# Patient Record
Sex: Male | Born: 1960 | Race: Black or African American | Hispanic: No | Marital: Married | Smoking: Former smoker
Health system: Southern US, Community
[De-identification: ages and names within clinical notes are randomized; demographics above are authoritative.]

## PROBLEM LIST (undated history)

## (undated) DIAGNOSIS — M1990 Unspecified osteoarthritis, unspecified site: Secondary | ICD-10-CM

## (undated) DIAGNOSIS — G4730 Sleep apnea, unspecified: Secondary | ICD-10-CM

## (undated) DIAGNOSIS — E785 Hyperlipidemia, unspecified: Secondary | ICD-10-CM

## (undated) DIAGNOSIS — T7840XA Allergy, unspecified, initial encounter: Secondary | ICD-10-CM

## (undated) DIAGNOSIS — I10 Essential (primary) hypertension: Secondary | ICD-10-CM

## (undated) HISTORY — DX: Allergy, unspecified, initial encounter: T78.40XA

## (undated) HISTORY — DX: Sleep apnea, unspecified: G47.30

## (undated) HISTORY — DX: Hyperlipidemia, unspecified: E78.5

## (undated) HISTORY — DX: Unspecified osteoarthritis, unspecified site: M19.90

---

## 1999-01-05 ENCOUNTER — Encounter: Payer: Self-pay | Admitting: Family Medicine

## 1999-01-05 ENCOUNTER — Ambulatory Visit (HOSPITAL_COMMUNITY): Admission: RE | Admit: 1999-01-05 | Discharge: 1999-01-05 | Payer: Self-pay | Admitting: Family Medicine

## 2000-08-15 ENCOUNTER — Emergency Department (HOSPITAL_COMMUNITY): Admission: EM | Admit: 2000-08-15 | Discharge: 2000-08-15 | Payer: Self-pay | Admitting: Emergency Medicine

## 2003-02-02 ENCOUNTER — Ambulatory Visit (HOSPITAL_COMMUNITY): Admission: RE | Admit: 2003-02-02 | Discharge: 2003-02-02 | Payer: Self-pay | Admitting: Family Medicine

## 2012-02-27 HISTORY — PX: OTHER SURGICAL HISTORY: SHX169

## 2012-06-09 ENCOUNTER — Encounter (HOSPITAL_COMMUNITY): Payer: Self-pay

## 2012-06-09 ENCOUNTER — Emergency Department (HOSPITAL_COMMUNITY)
Admission: EM | Admit: 2012-06-09 | Discharge: 2012-06-09 | Disposition: A | Discharge status: Home / Self Care | Payer: Non-veteran care | Attending: Emergency Medicine | Admitting: Emergency Medicine

## 2012-06-09 ENCOUNTER — Emergency Department (HOSPITAL_COMMUNITY): Payer: Non-veteran care

## 2012-06-09 DIAGNOSIS — Y9289 Other specified places as the place of occurrence of the external cause: Secondary | ICD-10-CM | POA: Insufficient documentation

## 2012-06-09 DIAGNOSIS — S79919A Unspecified injury of unspecified hip, initial encounter: Secondary | ICD-10-CM | POA: Insufficient documentation

## 2012-06-09 DIAGNOSIS — S99929A Unspecified injury of unspecified foot, initial encounter: Secondary | ICD-10-CM | POA: Insufficient documentation

## 2012-06-09 DIAGNOSIS — Y9301 Activity, walking, marching and hiking: Secondary | ICD-10-CM | POA: Insufficient documentation

## 2012-06-09 DIAGNOSIS — S8990XA Unspecified injury of unspecified lower leg, initial encounter: Secondary | ICD-10-CM | POA: Insufficient documentation

## 2012-06-09 DIAGNOSIS — X500XXA Overexertion from strenuous movement or load, initial encounter: Secondary | ICD-10-CM | POA: Insufficient documentation

## 2012-06-09 DIAGNOSIS — S8992XA Unspecified injury of left lower leg, initial encounter: Secondary | ICD-10-CM

## 2012-06-09 DIAGNOSIS — I10 Essential (primary) hypertension: Secondary | ICD-10-CM | POA: Insufficient documentation

## 2012-06-09 HISTORY — DX: Essential (primary) hypertension: I10

## 2012-06-09 MED ORDER — OXYCODONE-ACETAMINOPHEN 5-325 MG PO TABS
1.0000 | ORAL_TABLET | Freq: Four times a day (QID) | ORAL | Status: DC | PRN
Start: 2012-06-09 — End: 2013-09-18

## 2012-06-09 MED ORDER — HYDROMORPHONE HCL PF 1 MG/ML IJ SOLN
1.0000 mg | Freq: Once | INTRAMUSCULAR | Status: AC
  Administered 2012-06-09: 1 mg via INTRAVENOUS
  Filled 2012-06-09: qty 1

## 2012-06-09 MED ORDER — IBUPROFEN 400 MG PO TABS: 400.0000 mg | ORAL_TABLET | Freq: Four times a day (QID) | ORAL | Status: DC | PRN

## 2012-06-09 MED ORDER — ONDANSETRON HCL 4 MG/2ML IJ SOLN
4.0000 mg | Freq: Once | INTRAMUSCULAR | Status: AC
  Administered 2012-06-09: 4 mg via INTRAVENOUS
  Filled 2012-06-09: qty 2
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
## 2012-06-09 NOTE — ED Notes (Signed)
Given fentanyl in EMS
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
## 2012-06-12 NOTE — ED Provider Notes (Signed)
Medical screening examination/treatment/procedure(s) were performed by non-physician practitioner and as supervising physician I was immediately available for consultation/collaboration.   Laray Anger, DO 06/12/12 1525

## 2013-09-18 ENCOUNTER — Emergency Department (HOSPITAL_COMMUNITY)
Admission: EM | Admit: 2013-09-18 | Discharge: 2013-09-18 | Disposition: A | Discharge status: Home / Self Care | Payer: Non-veteran care | Attending: Emergency Medicine | Admitting: Emergency Medicine

## 2013-09-18 ENCOUNTER — Encounter (HOSPITAL_COMMUNITY): Payer: Self-pay | Admitting: Emergency Medicine

## 2013-09-18 ENCOUNTER — Emergency Department (HOSPITAL_COMMUNITY): Payer: Non-veteran care

## 2013-09-18 DIAGNOSIS — IMO0001 Reserved for inherently not codable concepts without codable children: Secondary | ICD-10-CM | POA: Insufficient documentation

## 2013-09-18 DIAGNOSIS — R079 Chest pain, unspecified: Secondary | ICD-10-CM | POA: Insufficient documentation

## 2013-09-18 DIAGNOSIS — R071 Chest pain on breathing: Secondary | ICD-10-CM | POA: Insufficient documentation

## 2013-09-18 DIAGNOSIS — R0789 Other chest pain: Secondary | ICD-10-CM

## 2013-09-18 DIAGNOSIS — Z79899 Other long term (current) drug therapy: Secondary | ICD-10-CM | POA: Insufficient documentation

## 2013-09-18 DIAGNOSIS — K5900 Constipation, unspecified: Secondary | ICD-10-CM | POA: Insufficient documentation

## 2013-09-18 DIAGNOSIS — F17200 Nicotine dependence, unspecified, uncomplicated: Secondary | ICD-10-CM | POA: Insufficient documentation

## 2013-09-18 DIAGNOSIS — I10 Essential (primary) hypertension: Secondary | ICD-10-CM | POA: Insufficient documentation

## 2013-09-18 LAB — COMPREHENSIVE METABOLIC PANEL
ALBUMIN: 4.5 g/dL (ref 3.5–5.2)
ALK PHOS: 76 U/L (ref 39–117)
ALT: 23 U/L (ref 0–53)
ANION GAP: 17 — AB (ref 5–15)
AST: 24 U/L (ref 0–37)
BILIRUBIN TOTAL: 0.2 mg/dL — AB (ref 0.3–1.2)
BUN: 16 mg/dL (ref 6–23)
CHLORIDE: 95 meq/L — AB (ref 96–112)
CO2: 23 meq/L (ref 19–32)
Calcium: 10.1 mg/dL (ref 8.4–10.5)
Creatinine, Ser: 1.2 mg/dL (ref 0.50–1.35)
GFR calc Af Amer: 79 mL/min — ABNORMAL LOW (ref >90)
GFR calc non Af Amer: 68 mL/min — ABNORMAL LOW (ref >90)
Glucose, Bld: 124 mg/dL — ABNORMAL HIGH (ref 70–99)
POTASSIUM: 3.9 meq/L (ref 3.7–5.3)
Sodium: 135 mEq/L — ABNORMAL LOW (ref 137–147)
Total Protein: 8.2 g/dL (ref 6.0–8.3)

## 2013-09-18 LAB — CBC WITH DIFFERENTIAL/PLATELET
BASOS PCT: 0 % (ref 0–1)
Basophils Absolute: 0 10*3/uL (ref 0.0–0.1)
Eosinophils Absolute: 0.1 10*3/uL (ref 0.0–0.7)
Eosinophils Relative: 1 % (ref 0–5)
HCT: 47.1 % (ref 39.0–52.0)
HEMOGLOBIN: 15.5 g/dL (ref 13.0–17.0)
LYMPHS ABS: 2.4 10*3/uL (ref 0.7–4.0)
LYMPHS PCT: 24 % (ref 12–46)
MCH: 30.2 pg (ref 26.0–34.0)
MCHC: 32.9 g/dL (ref 30.0–36.0)
MCV: 91.6 fL (ref 78.0–100.0)
MONOS PCT: 6 % (ref 3–12)
Monocytes Absolute: 0.6 10*3/uL (ref 0.1–1.0)
NEUTROS ABS: 6.9 10*3/uL (ref 1.7–7.7)
NEUTROS PCT: 69 % (ref 43–77)
Platelets: 234 10*3/uL (ref 150–400)
RBC: 5.14 MIL/uL (ref 4.22–5.81)
RDW: 13.3 % (ref 11.5–15.5)
WBC: 10 10*3/uL (ref 4.0–10.5)

## 2013-09-18 LAB — D-DIMER, QUANTITATIVE: D-Dimer, Quant: 0.51 ug/mL-FEU — ABNORMAL HIGH (ref 0.00–0.48)

## 2013-09-18 LAB — TROPONIN I: Troponin I: <0.3 ng/mL (ref <0.30)

## 2013-09-18 MED ORDER — METHOCARBAMOL 500 MG PO TABS
1000.0000 mg | ORAL_TABLET | Freq: Once | ORAL | Status: AC
  Administered 2013-09-18: 1000 mg via ORAL
  Filled 2013-09-18: qty 2

## 2013-09-18 MED ORDER — MORPHINE SULFATE 4 MG/ML IJ SOLN
4.0000 mg | Freq: Once | INTRAMUSCULAR | Status: AC
  Administered 2013-09-18: 4 mg via INTRAVENOUS
  Filled 2013-09-18: qty 1

## 2013-09-18 MED ORDER — IOHEXOL 350 MG/ML SOLN
100.0000 mL | Freq: Once | INTRAVENOUS | Status: AC | PRN
  Administered 2013-09-18: 100 mL via INTRAVENOUS

## 2013-09-18 MED ORDER — HYDROCODONE-ACETAMINOPHEN 5-325 MG PO TABS: 2.0000 | ORAL_TABLET | ORAL | Status: DC | PRN

## 2013-09-18 MED ORDER — OXYCODONE-ACETAMINOPHEN 5-325 MG PO TABS
1.0000 | ORAL_TABLET | Freq: Once | ORAL | Status: AC
  Administered 2013-09-18: 1 via ORAL
  Filled 2013-09-18: qty 1

## 2013-09-18 MED ORDER — ASPIRIN 81 MG PO CHEW
324.0000 mg | CHEWABLE_TABLET | Freq: Once | ORAL | Status: AC
  Administered 2013-09-18: 324 mg via ORAL
  Filled 2013-09-18: qty 4

## 2013-09-18 MED ORDER — POLYETHYLENE GLYCOL 3350 17 G PO PACK: 17.0000 g | PACK | Freq: Every day | ORAL | Status: DC

## 2013-09-18 MED ORDER — IBUPROFEN 600 MG PO TABS: 600.0000 mg | ORAL_TABLET | Freq: Four times a day (QID) | ORAL | Status: DC | PRN

## 2013-09-18 MED ORDER — LABETALOL HCL 5 MG/ML IV SOLN
10.0000 mg | Freq: Once | INTRAVENOUS | Status: AC
  Administered 2013-09-18: 10 mg via INTRAVENOUS
  Filled 2013-09-18: qty 4

## 2013-09-18 MED ORDER — METHOCARBAMOL 500 MG PO TABS: 500.0000 mg | ORAL_TABLET | Freq: Three times a day (TID) | ORAL | Status: DC | PRN

## 2013-09-18 NOTE — ED Notes (Signed)
Pt states he was sitting at home yesterday and began having left sided chest pain. Pt states this chest pain is unlike any other chest pain he has experienced. Rates 10/10 appears SOB. Pt is alert, oriented and ambulatory. Hx of hypertension.

## 2013-09-18 NOTE — ED Provider Notes (Signed)
CSN: [id number]     Arrival date & time 09/18/13  0057 History   First MD Initiated Contact with Patient 09/18/13 0101     Chief Complaint  Patient presents with  . Chest Pain     (Consider location/radiation/quality/duration/timing/severity/associated sxs/prior Treatment) HPI Patient presents with gradual onset left sided chest pain that started greater than 24 hours ago. The pain has been constant and persistent. The pain is worse with movement and palpation. He denies any known recent trauma. He's had no cough, fevers or chills. Patient has a history of hypertension for which she is followed at the [facility]. States he's not miss his medications recently. He has had no lower extremity swelling or pain. No extended travel or recent surgeries. Past Medical History  Diagnosis Date  . Hypertension    History reviewed. No pertinent past surgical history. History reviewed. No pertinent family history. History  Substance Use Topics  . Smoking status: Current Every Day Smoker    Types: Cigarettes  . Smokeless tobacco: Not on file  . Alcohol Use: Yes    Review of Systems  Constitutional: Negative for fever and chills.  Respiratory: Negative for cough, chest tightness and shortness of breath.   Cardiovascular: Positive for chest pain. Negative for palpitations and leg swelling.  Gastrointestinal: Positive for constipation. Negative for nausea, vomiting, abdominal pain, diarrhea and blood in stool.  Musculoskeletal: Positive for myalgias. Negative for back pain, neck pain and neck stiffness.  Skin: Negative for rash and wound.  Neurological: Negative for dizziness, weakness, light-headedness, numbness and headaches.  All other systems reviewed and are negative.     Allergies  Review of patient's allergies indicates no known allergies.  Home Medications   Prior to Admission medications   Medication Sig Start Date End Date Taking? Authorizing Provider  hydrochlorothiazide (HYDRODIURIL)  50 MG tablet Take 50 mg by mouth daily.   Yes Historical Provider, MD  lisinopril (PRINIVIL,ZESTRIL) 40 MG tablet Take 40 mg by mouth daily.   Yes Historical Provider, MD  HYDROcodone-acetaminophen (NORCO) 5-325 MG per tablet Take 2 tablets by mouth every 4 (four) hours as needed. 09/18/13   Loren Raceravid Jovi Zavadil, MD  ibuprofen (ADVIL,MOTRIN) 600 MG tablet Take 1 tablet (600 mg total) by mouth every 6 (six) hours as needed. 09/18/13   Loren Raceravid Dala Breault, MD  methocarbamol (ROBAXIN) 500 MG tablet Take 1 tablet (500 mg total) by mouth every 8 (eight) hours as needed for muscle spasms. 09/18/13   Loren Raceravid Rochelle Larue, MD  polyethylene glycol [facility](MIRALAX / Ethelene HalGLYCOLAX) packet Take 17 g by mouth daily. 09/18/13   Loren Raceravid Manas Hickling, MD   BP 160/108  Pulse 79  Temp(Src) 98.1 F (36.7 C) (Oral)  Resp 17  SpO2 98% Physical Exam  Nursing note and vitals reviewed. Constitutional: He is oriented to person, place, and time. He appears well-developed and well-nourished. No distress.  HENT:  Head: Normocephalic and atraumatic.  Mouth/Throat: Oropharynx is clear and moist.  Eyes: EOM are normal. Pupils are equal, round, and reactive to light.  Neck: Normal range of motion. Neck supple.  Cardiovascular: Normal rate and regular rhythm.   Pulmonary/Chest: Effort normal and breath sounds normal. No respiratory distress. He has no wheezes. He has no rales. He exhibits tenderness.  Patient's chest wall pain is completely reproduced with palpation of the left pectoralis and range of motion of the left shoulder. He has no crepitance or deformity.  Abdominal: Soft. Bowel sounds are normal. He exhibits distension (abdomen is firm and distended). He exhibits no mass. There  is no tenderness. There is no rebound and no guarding.  Musculoskeletal: Normal range of motion. He exhibits no edema and no tenderness.  No calf swelling or tenderness.  Neurological: He is alert and oriented to person, place, and time.  Patient is alert and oriented x3  with clear, goal oriented speech. Patient has 5/5 motor in all extremities. Sensation is intact to light touch.  Skin: Skin is warm and dry. No rash noted. No erythema.  Psychiatric: He has a normal mood and affect. His behavior is normal.    ED Course  Procedures (including critical care time) Labs Review Labs Reviewed  COMPREHENSIVE METABOLIC PANEL - Abnormal; Notable for the following:    Sodium 135 (*)    Chloride 95 (*)    Glucose, Bld 124 (*)    Total Bilirubin 0.2 (*)    GFR calc non Af Amer 68 (*)    GFR calc Af Amer 79 (*)    Anion gap 17 (*)    All other components within normal limits  D-DIMER, QUANTITATIVE - Abnormal; Notable for the following:    D-Dimer, Quant 0.51 (*)    All other components within normal limits  CBC WITH DIFFERENTIAL  TROPONIN I    Imaging Review Ct Angio Chest Pe W/cm &/or Wo Cm  09/18/2013   CLINICAL DATA:  Left chest pain and shortness breath for 2 days. Elevated D-dimer levels. Question pulmonary embolism.  EXAM: CT ANGIOGRAPHY CHEST WITH CONTRAST  TECHNIQUE: Multidetector CT imaging of the chest was performed using the standard protocol during bolus administration of intravenous contrast. Multiplanar CT image reconstructions and MIPs were obtained to evaluate the vascular anatomy.  CONTRAST:  OMNIPAQUE IOHEXOL 350 MG/ML SOLN  COMPARISON:  Acute abdominal series done 09/18/2013.  FINDINGS: Vascular: The pulmonary arteries are well opacified with contrast. There is no evidence of acute pulmonary embolism. There is no significant atherosclerosis.  Mediastinum: There are no enlarged mediastinal, hilar or axillary lymph nodes. The thyroid gland and trachea appear normal. There is a small hiatal hernia. The heart size is normal.  Lungs/Pleura: There is no pleural or pericardial effusion.There are streaky opacities in both lung bases which appear predominantly linear on the reformatted images. These are favored to reflect atelectasis or scarring. There  is no confluent airspace opacity or endobronchial lesion.  Upper abdomen: Unremarkable.  There is no adrenal mass.  Musculoskeletal/Chest wall: No chest wall lesion or acute osseous findings.  Review of the MIP images confirms the above findings.  IMPRESSION: 1. No evidence acute pulmonary embolism or other acute chest process. 2. Linear opacities at both lung bases most consistent with atelectasis or scarring. No confluent airspace opacity, pleural effusion or adenopathy.   Electronically Signed   By: Roxy Horseman M.D.   On: 09/18/2013 02:55   Dg Abd Acute W/chest  09/18/2013   CLINICAL DATA:  Worsening left-sided chest pain.  EXAM: ACUTE ABDOMEN SERIES (ABDOMEN 2 VIEW & CHEST 1 VIEW)  COMPARISON:  None.  FINDINGS: The lungs are well-aerated. Minimal bibasilar opacities likely reflect atelectasis. There is no evidence of pleural effusion or pneumothorax. The cardiomediastinal silhouette is within normal limits.  The visualized bowel gas pattern is unremarkable. Scattered stool and air are seen within the colon; there is no evidence of small bowel dilatation to suggest obstruction. No free intra-abdominal air is identified on the provided upright view.  No acute osseous abnormalities are seen; the sacroiliac joints are unremarkable in appearance.  IMPRESSION: 1. Unremarkable bowel gas pattern;  no free intra-abdominal air seen. Moderate amount of stool in the colon. 2. Minimal bibasilar airspace opacities likely reflect atelectasis. Lungs otherwise clear.   Electronically Signed   By: Roanna Raider M.D.   On: 09/18/2013 01:42     EKG Interpretation   Date/Time:  Friday September 18 2013 01:05:00 EDT Ventricular Rate:  105 PR Interval:  135 QRS Duration: 142 QT Interval:  356 QTC Calculation: 470 R Axis:   1 Text Interpretation:  Sinus tachycardia LAE, consider biatrial enlargement  Right bundle branch block Confirmed by Ivorie Uplinger  MD, Bilaal Leib ([id number]) on  09/18/2013 4:40:37 AM      MDM   Final  diagnoses:  Left-sided chest wall pain  Constipation, unspecified constipation type  Essential hypertension    D-dimer is slightly positive. CT angios chest without any acute findings. Patient's chest pain is completely reproducible with palpation and movement of the left shoulder consistent with muscular strain/chest wall pain. I have very low suspicion for coronary artery disease. Patient has no acute findings on his EKG. The pain has been constant for greater than 24 hours and the patient still has a normal troponin. He's been treated in the emergency department and his symptoms improved. His blood pressure in his also improved with medication. He's been advised of the need to followup with his primary Dr. regarding his elevated blood pressures. He's been given return precautions and is voiced understanding. Will also start the patient on MiraLax for his constipation. His abdomen is benign no moderate amount stool is seen on the x-ray. He admits to sparse bowel movements.    Loren Racer, MD 09/18/13 [phone]

## 2013-09-18 NOTE — Discharge Instructions (Signed)
Chest Wall Pain °Chest wall pain is pain in or around the bones and muscles of your chest. It may take up to 6 weeks to get better. It may take longer if you must stay physically active in your work and activities.  °CAUSES  °Chest wall pain may happen on its own. However, it may be caused by: °· A viral illness like the flu. °· Injury. °· Coughing. °· Exercise. °· Arthritis. °· Fibromyalgia. °· Shingles. °HOME CARE INSTRUCTIONS  °· Avoid overtiring physical activity. Try not to strain or perform activities that cause pain. This includes any activities using your chest or your abdominal and side muscles, especially if heavy weights are used. °· Put ice on the sore area. °¨ Put ice in a plastic bag. °¨ Place a towel between your skin and the bag. °¨ Leave the ice on for 15-20 minutes per hour while awake for the first 2 days. °· Only take over-the-counter or prescription medicines for pain, discomfort, or fever as directed by your caregiver. °SEEK IMMEDIATE MEDICAL CARE IF:  °· Your pain increases, or you are very uncomfortable. °· You have a fever. °· Your chest pain becomes worse. °· You have new, unexplained symptoms. °· You have nausea or vomiting. °· You feel sweaty or lightheaded. °· You have a cough with phlegm (sputum), or you cough up blood. °MAKE SURE YOU:  °· Understand these instructions. °· Will watch your condition. °· Will get help right away if you are not doing well or get worse. °Document Released: 02/12/2005 Document Revised: 05/07/2011 Document Reviewed: 10/09/2010 °Exit[employer]® Patient Information ©2015 [employer]. This information is not intended to replace advice given to you by your health care provider. Make sure you discuss any questions you have with your health care provider. ° °Hypertension °Hypertension, commonly called high blood pressure, is when the force of blood pumping through your arteries is too strong. Your arteries are the blood vessels that carry blood from your heart  throughout your body. A blood pressure reading consists of a higher number over a lower number, such as 110/72. The higher number (systolic) is the pressure inside your arteries when your heart pumps. The lower number (diastolic) is the pressure inside your arteries when your heart relaxes. Ideally you want your blood pressure below 120/80. °Hypertension forces your heart to work harder to pump blood. Your arteries may become narrow or stiff. Having hypertension puts you at risk for heart disease, stroke, and other problems.  °RISK FACTORS °Some risk factors for high blood pressure are controllable. Others are not.  °Risk factors you cannot control include:  °· Race. You may be at higher risk if you are African American. °· Age. Risk increases with age. °· Gender. Men are at higher risk than women before age 45 years. After age 65, women are at higher risk than men. °Risk factors you can control include: °· Not getting enough exercise or physical activity. °· Being overweight. °· Getting too much fat, sugar, calories, or salt in your diet. °· Drinking too much alcohol. °SIGNS AND SYMPTOMS °Hypertension does not usually cause signs or symptoms. Extremely high blood pressure (hypertensive crisis) may cause headache, anxiety, shortness of breath, and nosebleed. °DIAGNOSIS  °To check if you have hypertension, your health care provider will measure your blood pressure while you are seated, with your arm held at the level of your heart. It should be measured at least twice using the same arm. Certain conditions can cause a difference in blood pressure between   your right and left arms. A blood pressure reading that is higher than normal on one occasion does not mean that you need treatment. If one blood pressure reading is high, ask your health care provider about having it checked again. TREATMENT  Treating high blood pressure includes making lifestyle changes and possibly taking medicine. Living a healthy lifestyle can  help lower high blood pressure. You may need to change some of your habits. Lifestyle changes may include:  Following the DASH diet. This diet is high in fruits, vegetables, and whole grains. It is low in salt, red meat, and added sugars.  Getting at least 2 hours of brisk physical activity every week.  Losing weight if necessary.  Not smoking.  Limiting alcoholic beverages.  Learning ways to reduce stress. If lifestyle changes are not enough to get your blood pressure under control, your health care provider may prescribe medicine. You may need to take more than one. Work closely with your health care provider to understand the risks and benefits. HOME CARE INSTRUCTIONS  Have your blood pressure rechecked as directed by your health care provider.   Take medicines only as directed by your health care provider. Follow the directions carefully. Blood pressure medicines must be taken as prescribed. The medicine does not work as well when you skip doses. Skipping doses also puts you at risk for problems.   Do not smoke.   Monitor your blood pressure at home as directed by your health care provider. SEEK MEDICAL CARE IF:   You think you are having a reaction to medicines taken.  You have recurrent headaches or feel dizzy.  You have swelling in your ankles.  You have trouble with your vision. SEEK IMMEDIATE MEDICAL CARE IF:  You develop a severe headache or confusion.  You have unusual weakness, numbness, or feel faint.  You have severe chest or abdominal pain.  You vomit repeatedly.  You have trouble breathing. MAKE SURE YOU:   Understand these instructions.  Will watch your condition.  Will get help right away if you are not doing well or get worse. Document Released: 02/12/2005 Document Revised: 06/29/2013 Document Reviewed: 12/05/2012 [facility] Patient Information 2015 [place], [place]. This information is not intended to replace advice given to you by your  health care provider. Make sure you discuss any questions you have with your health care provider. Constipation Constipation is when a person has fewer than three bowel movements a week, has difficulty having a bowel movement, or has stools that are dry, hard, or larger than normal. As people grow older, constipation is more common. If you try to fix constipation with medicines that make you have a bowel movement (laxatives), the problem may get worse. Long-term laxative use may cause the muscles of the colon to become weak. A low-fiber diet, not taking in enough fluids, and taking certain medicines may make constipation worse.  CAUSES  Certain medicines, such as antidepressants, pain medicine, iron supplements, antacids, and water pills.  Certain diseases, such as diabetes, irritable bowel syndrome (IBS), thyroid disease, or depression.  Not drinking enough water.  Not eating enough fiber-rich foods.  Stress or travel.  Lack of physical activity or exercise.  Ignoring the urge to have a bowel movement.  Using laxatives too much.  SIGNS AND SYMPTOMS  Having fewer than three bowel movements a week.  Straining to have a bowel movement.  Having stools that are hard, dry, or larger than normal.  Feeling full or bloated.  Pain in the  lower abdomen.  Not feeling relief after having a bowel movement.  DIAGNOSIS  Your health care provider will take a medical history and perform a physical exam. Further testing may be done for severe constipation. Some tests may include: A barium enema X-ray to examine your rectum, colon, and, sometimes, your small intestine.  A sigmoidoscopy to examine your lower colon.  A colonoscopy to examine your entire colon. TREATMENT  Treatment will depend on the severity of your constipation and what is causing it. Some dietary treatments include drinking more fluids and eating more fiber-rich foods. Lifestyle treatments may include regular exercise. If these  diet and lifestyle recommendations do not help, your health care provider may recommend taking over-the-counter laxative medicines to help you have bowel movements. Prescription medicines may be prescribed if over-the-counter medicines do not work.  HOME CARE INSTRUCTIONS  Eat foods that have a lot of fiber, such as fruits, vegetables, whole grains, and beans. Limit foods high in fat and processed sugars, such as french fries, hamburgers, cookies, candies, and soda.  A fiber supplement may be added to your diet if you cannot get enough fiber from foods.  Drink enough fluids to keep your urine clear or pale yellow.  Exercise regularly or as directed by your health care provider.  Go to the restroom when you have the urge to go. Do not hold it.  Only take over-the-counter or prescription medicines as directed by your health care provider. Do not take other medicines for constipation without talking to your health care provider first.  SEEK IMMEDIATE MEDICAL CARE IF:  You have bright red blood in your stool.  Your constipation lasts for more than 4 days or gets worse.  You have abdominal or rectal pain.  You have thin, pencil-like stools.  You have unexplained weight loss. MAKE SURE YOU:  Understand these instructions. Will watch your condition. Will get help right away if you are not doing well or get worse. Document Released: 11/11/2003 Document Revised: 02/17/2013 Document Reviewed: 11/24/2012 [facility] Patient Information 2015 [place], [place]. This information is not intended to replace advice given to you by your health care provider. Make sure you discuss any questions you have with your health care provider.

## 2017-02-13 ENCOUNTER — Encounter: Payer: Self-pay | Admitting: Gastroenterology

## 2017-03-27 ENCOUNTER — Ambulatory Visit (AMBULATORY_SURGERY_CENTER): Payer: Self-pay | Admitting: *Deleted

## 2017-03-27 ENCOUNTER — Other Ambulatory Visit: Payer: Self-pay

## 2017-03-27 VITALS — Ht 67.5 in | Wt 204.4 lb

## 2017-03-27 DIAGNOSIS — Z1211 Encounter for screening for malignant neoplasm of colon: Secondary | ICD-10-CM

## 2017-03-27 MED ORDER — PEG-KCL-NACL-NASULF-NA ASC-C 100 G PO SOLR: 1.0000 | Freq: Once | ORAL | 0 refills | Status: AC

## 2017-03-27 NOTE — Progress Notes (Signed)
No egg or soy allergy known to patient  No issues with past sedation with any surgeries  or procedures, no intubation problems  No diet pills per patient No home 02 use per patient  No blood thinners per patient  Pt denies issues with constipation  No A fib or A flutter  EMMI video sent to pt's e mail  

## 2017-04-08 ENCOUNTER — Encounter: Payer: Self-pay | Admitting: Gastroenterology

## 2017-04-08 ENCOUNTER — Ambulatory Visit (AMBULATORY_SURGERY_CENTER): Payer: Non-veteran care | Admitting: Gastroenterology

## 2017-04-08 VITALS — BP 149/106 | HR 64 | Temp 98.4°F | Resp 10 | Ht 67.0 in | Wt 204.0 lb

## 2017-04-08 DIAGNOSIS — Z1211 Encounter for screening for malignant neoplasm of colon: Secondary | ICD-10-CM

## 2017-04-08 DIAGNOSIS — Z1212 Encounter for screening for malignant neoplasm of rectum: Secondary | ICD-10-CM

## 2017-04-08 MED ORDER — SODIUM CHLORIDE 0.9 % IV SOLN
500.0000 mL | Freq: Once | INTRAVENOUS | Status: DC
Start: 2017-04-08 — End: 2022-05-11

## 2017-04-08 NOTE — Progress Notes (Signed)
Pt's states no medical or surgical changes since previsit or office visit. 

## 2017-04-08 NOTE — Op Note (Signed)
[facility] Patient Name: Ricky MediateRobert Peck Procedure Date: 04/08/2017 9:39 AM MRN: [id number] Endoscopist: Sherilyn CooterHenry L. Myrtie Neitheranis , MD Age: 57 Referring MD:  Date of Birth: 06/17/1960 Gender: Male Account #: [id number] Procedure:                Colonoscopy Indications:              Screening for colorectal malignant neoplasm, This                            is the patient's first colonoscopy Medicines:                Monitored Anesthesia Care Procedure:                Pre-Anesthesia Assessment:                           - Prior to the procedure, a History and Physical                            was performed, and patient medications and                            allergies were reviewed. The patient's tolerance of                            previous anesthesia was also reviewed. The risks                            and benefits of the procedure and the sedation                            options and risks were discussed with the patient.                            All questions were answered, and informed consent                            was obtained. Prior Anticoagulants: The patient has                            taken no previous anticoagulant or antiplatelet                            agents. ASA Grade Assessment: II - A patient with                            mild systemic disease. After reviewing the risks                            and benefits, the patient was deemed in                            satisfactory condition to undergo the procedure.  After obtaining informed consent, the colonoscope                            was passed under direct vision. Throughout the                            procedure, the patient's blood pressure, pulse, and                            oxygen saturations were monitored continuously. The                            Colonoscope was introduced through the anus and                            advanced to the the cecum,  identified by                            appendiceal orifice and ileocecal valve. The                            colonoscopy was performed without difficulty. The                            patient tolerated the procedure well. The quality                            of the bowel preparation was good after lavage. The                            ileocecal valve, appendiceal orifice, and rectum                            were photographed. The bowel preparation used was                            SUPREP. Scope In: 9:53:07 AM Scope Out: 10:04:29 AM Scope Withdrawal Time: 0 hours 7 minutes 53 seconds  Total Procedure Duration: 0 hours 11 minutes 22 seconds  Findings:                 The perianal and digital rectal examinations were                            normal.                           The entire examined colon appeared normal on direct                            and retroflexion views. Complications:            No immediate complications. Estimated Blood Loss:     Estimated blood loss: none. Impression:               - The entire examined colon is normal on  direct and                            retroflexion views.                           - No specimens collected. Recommendation:           - Patient has a contact number available for                            emergencies. The signs and symptoms of potential                            delayed complications were discussed with the                            patient. Return to normal activities tomorrow.                            Written discharge instructions were provided to the                            patient.                           - Resume previous diet.                           - Continue present medications.                           - Repeat colonoscopy in 10 years for screening                            purposes. Ricky Peck L. Myrtie Neither, MD 04/08/2017 10:11:31 AM This report has been signed electronically.

## 2017-04-08 NOTE — Patient Instructions (Signed)
YOU HAD AN ENDOSCOPIC PROCEDURE TODAY AT THE [facility]:   Refer to the procedure report that was given to you for any specific questions about what was found during the examination.  If the procedure report does not answer your questions, please call your gastroenterologist to clarify.  If you requested that your care partner not be given the details of your procedure findings, then the procedure report has been included in a sealed envelope for you to review at your convenience later.  YOU SHOULD EXPECT: Some feelings of bloating in the abdomen. Passage of more gas than usual.  Walking can help get rid of the air that was put into your GI tract during the procedure and reduce the bloating. If you had a lower endoscopy (such as a colonoscopy or flexible sigmoidoscopy) you may notice spotting of blood in your stool or on the toilet paper. If you underwent a bowel prep for your procedure, you may not have a normal bowel movement for a few days.  Please Note:  You might notice some irritation and congestion in your nose or some drainage.  This is from the oxygen used during your procedure.  There is no need for concern and it should clear up in a day or so.  SYMPTOMS TO REPORT IMMEDIATELY:   Following lower endoscopy (colonoscopy or flexible sigmoidoscopy):  Excessive amounts of blood in the stool  Significant tenderness or worsening of abdominal pains  Swelling of the abdomen that is new, acute  Fever of 100F or higher  For urgent or emergent issues, a gastroenterologist can be reached at any hour by calling [phone].   DIET:  We do recommend a small meal at first, but then you may proceed to your regular diet.  Drink plenty of fluids but you should avoid alcoholic beverages for 24 hours.  MEDICATIONS:  Continue present medications.  ACTIVITY:  You should plan to take it easy for the rest of today and you should NOT DRIVE or use heavy machinery until tomorrow (because of the  sedation medicines used during the test).    FOLLOW UP: Our staff will call the number listed on your records the next business day following your procedure to check on you and address any questions or concerns that you may have regarding the information given to you following your procedure. If we do not reach you, we will leave a message.  However, if you are feeling well and you are not experiencing any problems, there is no need to return our call.  We will assume that you have returned to your regular daily activities without incident.  If any biopsies were taken you will be contacted by phone or by letter within the next 1-3 weeks.  Please call us at [phone] if you have not heard about the biopsies in 3 weeks.   Thank you for allowing us to provide for your healthcare needs today.   SIGNATURES/CONFIDENTIALITY: You and/or your care partner have signed paperwork which will be entered into your electronic medical record.  These signatures attest to the fact that that the information above on your After Visit Summary has been reviewed and is understood.  Full responsibility of the confidentiality of this discharge information lies with you and/or your care-partner. 

## 2017-04-08 NOTE — Progress Notes (Signed)
To PACU, VSS. Report to [email or web address] 

## 2017-04-09 ENCOUNTER — Telehealth: Payer: Self-pay

## 2017-04-09 NOTE — Telephone Encounter (Signed)
  Follow up Call-  Call back number 04/08/2017  Post procedure Call Back phone  # [phone]  Permission to leave phone message No  Some recent data might be hidden     Patient questions:  Do you have a fever, pain , or abdominal swelling? No. Pain Score  0 *  Have you tolerated food without any problems? Yes.    Have you been able to return to your normal activities? Yes.    Do you have any questions about your discharge instructions: Diet   No. Medications  No. Follow up visit  No.  Do you have questions or concerns about your Care? No.  Actions: * If pain score is 4 or above: No action needed, pain <4.  No problems noted per pt. maw

## 2017-06-26 ENCOUNTER — Telehealth: Payer: Self-pay | Admitting: Radiation Oncology

## 2017-06-26 NOTE — Telephone Encounter (Signed)
Opened in error

## 2019-12-09 ENCOUNTER — Emergency Department (HOSPITAL_COMMUNITY): Payer: No Typology Code available for payment source

## 2019-12-09 ENCOUNTER — Emergency Department (HOSPITAL_COMMUNITY)
Admission: EM | Admit: 2019-12-09 | Discharge: 2019-12-09 | Disposition: A | Discharge status: Home / Self Care | Payer: No Typology Code available for payment source | Attending: Emergency Medicine | Admitting: Emergency Medicine

## 2019-12-09 ENCOUNTER — Encounter (HOSPITAL_COMMUNITY): Payer: Self-pay | Admitting: Emergency Medicine

## 2019-12-09 DIAGNOSIS — R519 Headache, unspecified: Secondary | ICD-10-CM | POA: Diagnosis present

## 2019-12-09 DIAGNOSIS — M546 Pain in thoracic spine: Secondary | ICD-10-CM | POA: Insufficient documentation

## 2019-12-09 DIAGNOSIS — I10 Essential (primary) hypertension: Secondary | ICD-10-CM | POA: Insufficient documentation

## 2019-12-09 DIAGNOSIS — Z87891 Personal history of nicotine dependence: Secondary | ICD-10-CM | POA: Insufficient documentation

## 2019-12-09 DIAGNOSIS — M542 Cervicalgia: Secondary | ICD-10-CM | POA: Insufficient documentation

## 2019-12-09 DIAGNOSIS — Z79899 Other long term (current) drug therapy: Secondary | ICD-10-CM | POA: Insufficient documentation

## 2019-12-09 DIAGNOSIS — Y92410 Unspecified street and highway as the place of occurrence of the external cause: Secondary | ICD-10-CM | POA: Insufficient documentation

## 2019-12-09 MED ORDER — ACETAMINOPHEN 325 MG PO TABS
650.0000 mg | ORAL_TABLET | Freq: Once | ORAL | Status: AC
  Administered 2019-12-09: 650 mg via ORAL
  Filled 2019-12-09: qty 2
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
## 2019-12-09 NOTE — Discharge Instructions (Addendum)
Your blood pressure is noted to be elevated in the ED today. Be sure to take your blood pressure medication as soon as you get home.

## 2019-12-09 NOTE — ED Notes (Signed)
Patient transported to x-ray. ?
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
## 2019-12-09 NOTE — ED Notes (Signed)
Patient transported to CT 

## 2021-08-12 ENCOUNTER — Ambulatory Visit
Admission: EM | Admit: 2021-08-12 | Discharge: 2021-08-12 | Disposition: A | Discharge status: Home / Self Care | Payer: No Typology Code available for payment source | Attending: Family Medicine | Admitting: Family Medicine

## 2021-08-12 DIAGNOSIS — N3090 Cystitis, unspecified without hematuria: Secondary | ICD-10-CM | POA: Diagnosis not present

## 2021-08-12 DIAGNOSIS — Z20828 Contact with and (suspected) exposure to other viral communicable diseases: Secondary | ICD-10-CM | POA: Diagnosis not present

## 2021-08-12 LAB — POCT URINALYSIS DIP (MANUAL ENTRY)
Glucose, UA: NEGATIVE mg/dL
Nitrite, UA: POSITIVE — AB
Protein Ur, POC: >=300 mg/dL — AB
Spec Grav, UA: 1.025 (ref 1.010–1.025)
Urobilinogen, UA: 1 E.U./dL
pH, UA: 5.5 (ref 5.0–8.0)

## 2021-08-12 MED ORDER — SULFAMETHOXAZOLE-TRIMETHOPRIM 800-160 MG PO TABS: 1.0000 | ORAL_TABLET | Freq: Two times a day (BID) | ORAL | 0 refills | Status: AC

## 2021-08-12 NOTE — ED Triage Notes (Signed)
Pt states he thinks he has a UTI that started a few days ago  Pt states he is having urination problems  Pt states he had some body aches and cold chills last night

## 2021-08-14 LAB — URINE CULTURE: Culture: >=100000 — AB

## 2021-08-14 NOTE — ED Provider Notes (Addendum)
[facility]    ASSESSMENT & PLAN:  1. Cystitis   2. Exposure to the flu    No signs/symptoms of pyelonephritis. Begin: Meds ordered this encounter  Medications   sulfamethoxazole-trimethoprim (BACTRIM DS) 800-160 MG tablet    Sig: Take 1 tablet by mouth 2 (two) times daily for 7 days.    Dispense:  14 tablet    Refill:  0   Tolerating PO intake without emesis. Urine culture sent.   Follow-up Information     [facility] EMERGENCY DEPARTMENT.   Specialty: Emergency Medicine Why: If worsening or failing to improve as anticipated. Contact information: [address] [phone] mc [place] [address] [phone]               Labs Reviewed  URINE CULTURE - Abnormal; Notable for the following components:      Result Value   Culture >=100,000 COLONIES/mL ESCHERICHIA COLI (*)    Organism ID, Bacteria ESCHERICHIA COLI (*)    All other components within normal limits  POCT URINALYSIS DIP (MANUAL ENTRY) - Abnormal; Notable for the following components:   Color, UA red (*)    Bilirubin, UA small (*)    Ketones, POC UA trace (5) (*)    Blood, UA large (*)    Protein Ur, POC >=300 (*)    Nitrite, UA Positive (*)    Leukocytes, UA Large (3+) (*)    All other components within normal limits  COVID-19, FLU A+B NAA   E.Coli is sensitive to Bactrim.  Outlined signs and symptoms indicating need for more acute intervention. Patient verbalized understanding. After Visit Summary given.  SUBJECTIVE:  [facility] is a 61 y.o. male who complains of urinary frequency, urgency and mild dysuria for the past 1-2 days. Without associated flank pain, fever, chills, penile discharge or bleeding. Body aches last evening. Gross hematuria: not present. No specific aggravating or alleviating factors reported. No LE edema. Normal PO intake without n/v/d. Without specific abdominal pain. Ambulatory without difficulty.  OBJECTIVE:  Vitals:   08/12/21 1147   BP: 111/75  Pulse: 87  Resp: 20  Temp: 99 F (37.2 C)  TempSrc: Oral  SpO2: 95%   General appearance: alert; no distress HENT: oropharynx: moist Lungs: unlabored respirations Abdomen: soft Back: no CVA tenderness Extremities: no edema; symmetrical with no gross deformities Skin: warm and dry Neurologic: normal gait Psychological: alert and cooperative; normal mood and affect  Labs Reviewed  URINE CULTURE - Abnormal; Notable for the following components:      Result Value   Culture >=100,000 COLONIES/mL ESCHERICHIA COLI (*)    Organism ID, Bacteria ESCHERICHIA COLI (*)    All other components within normal limits  POCT URINALYSIS DIP (MANUAL ENTRY) - Abnormal; Notable for the following components:   Color, UA red (*)    Bilirubin, UA small (*)    Ketones, POC UA trace (5) (*)    Blood, UA large (*)    Protein Ur, POC >=300 (*)    Nitrite, UA Positive (*)    Leukocytes, UA Large (3+) (*)    All other components within normal limits  COVID-19, FLU A+B NAA    No Known Allergies  Past Medical History:  Diagnosis Date   Allergy    Arthritis    Hyperlipidemia    Hypertension    Sleep apnea    cpap   Social History   Socioeconomic History   Marital status: Married    Spouse name: Not on  file   Number of children: Not on file   Years of education: Not on file   Highest education level: Not on file  Occupational History   Not on file  Tobacco Use   Smoking status: Former    Types: Cigarettes    Quit date: 02/25/2017    Years since quitting: 4.4   Smokeless tobacco: Never  Vaping Use   Vaping Use: Never used  Substance and Sexual Activity   Alcohol use: Yes    Comment: occasionally   Drug use: No   Sexual activity: Yes    Birth control/protection: None  Other Topics Concern   Not on file  Social History Narrative   Not on file   Social Determinants of Health   Financial Resource Strain: Not on file  Food Insecurity: Not on file  Transportation  Needs: Not on file  Physical Activity: Not on file  Stress: Not on file  Social Connections: Not on file  Intimate Partner Violence: Not on file   Family History  Problem Relation Age of Onset   Colon cancer Neg Hx    Colon polyps Neg Hx    Esophageal cancer Neg Hx    Rectal cancer Neg Hx    Stomach cancer Neg Hx         Mardella Layman, MD 08/14/21 1123    Mardella Layman, MD 08/14/21 1124

## 2021-08-15 LAB — COVID-19, FLU A+B NAA
Influenza A, NAA: NOT DETECTED
Influenza B, NAA: NOT DETECTED
SARS-CoV-2, NAA: NOT DETECTED

## 2021-10-19 ENCOUNTER — Emergency Department (HOSPITAL_COMMUNITY): Payer: No Typology Code available for payment source

## 2021-10-19 ENCOUNTER — Emergency Department (HOSPITAL_COMMUNITY)
Admission: EM | Admit: 2021-10-19 | Discharge: 2021-10-20 | Disposition: A | Discharge status: Home / Self Care | Payer: No Typology Code available for payment source | Attending: Emergency Medicine | Admitting: Emergency Medicine

## 2021-10-19 ENCOUNTER — Other Ambulatory Visit: Payer: Self-pay

## 2021-10-19 ENCOUNTER — Encounter (HOSPITAL_COMMUNITY): Payer: Self-pay | Admitting: Emergency Medicine

## 2021-10-19 DIAGNOSIS — I10 Essential (primary) hypertension: Secondary | ICD-10-CM | POA: Insufficient documentation

## 2021-10-19 DIAGNOSIS — Z7901 Long term (current) use of anticoagulants: Secondary | ICD-10-CM | POA: Diagnosis not present

## 2021-10-19 DIAGNOSIS — Z79899 Other long term (current) drug therapy: Secondary | ICD-10-CM | POA: Diagnosis not present

## 2021-10-19 DIAGNOSIS — M25562 Pain in left knee: Secondary | ICD-10-CM

## 2021-10-19 DIAGNOSIS — R2242 Localized swelling, mass and lump, left lower limb: Secondary | ICD-10-CM | POA: Diagnosis not present

## 2021-10-19 LAB — CBC WITH DIFFERENTIAL/PLATELET
Abs Immature Granulocytes: 0.05 10*3/uL (ref 0.00–0.07)
Basophils Absolute: 0 10*3/uL (ref 0.0–0.1)
Basophils Relative: 0 %
Eosinophils Absolute: 0.1 10*3/uL (ref 0.0–0.5)
Eosinophils Relative: 1 %
HCT: 46.1 % (ref 39.0–52.0)
Hemoglobin: 14.5 g/dL (ref 13.0–17.0)
Immature Granulocytes: 1 %
Lymphocytes Relative: 20 %
Lymphs Abs: 2.1 10*3/uL (ref 0.7–4.0)
MCH: 29.2 pg (ref 26.0–34.0)
MCHC: 31.5 g/dL (ref 30.0–36.0)
MCV: 92.8 fL (ref 80.0–100.0)
Monocytes Absolute: 0.8 10*3/uL (ref 0.1–1.0)
Monocytes Relative: 8 %
Neutro Abs: 7.2 10*3/uL (ref 1.7–7.7)
Neutrophils Relative %: 70 %
Platelets: 176 10*3/uL (ref 150–400)
RBC: 4.97 MIL/uL (ref 4.22–5.81)
RDW: 14.3 % (ref 11.5–15.5)
WBC: 10.1 10*3/uL (ref 4.0–10.5)
nRBC: 0 % (ref 0.0–0.2)

## 2021-10-19 LAB — SYNOVIAL CELL COUNT + DIFF, W/ CRYSTALS
Crystals, Fluid: NONE SEEN
Eosinophils-Synovial: 1 % (ref 0–1)
Lymphocytes-Synovial Fld: 42 % — ABNORMAL HIGH (ref 0–20)
Monocyte-Macrophage-Synovial Fluid: 5 % — ABNORMAL LOW (ref 50–90)
Neutrophil, Synovial: 39 % — ABNORMAL HIGH (ref 0–25)
Other Cells-SYN: 13
WBC, Synovial: 39 /mm3 (ref 0–200)

## 2021-10-19 LAB — BASIC METABOLIC PANEL
Anion gap: 6 (ref 5–15)
BUN: 12 mg/dL (ref 6–20)
CO2: 25 mmol/L (ref 22–32)
Calcium: 9.4 mg/dL (ref 8.9–10.3)
Chloride: 103 mmol/L (ref 98–111)
Creatinine, Ser: 1.21 mg/dL (ref 0.61–1.24)
GFR, Estimated: >60 mL/min (ref >60)
Glucose, Bld: 103 mg/dL — ABNORMAL HIGH (ref 70–99)
Potassium: 4.2 mmol/L (ref 3.5–5.1)
Sodium: 134 mmol/L — ABNORMAL LOW (ref 135–145)

## 2021-10-19 LAB — C-REACTIVE PROTEIN: CRP: 10.6 mg/dL — ABNORMAL HIGH (ref <1.0)

## 2021-10-19 LAB — SEDIMENTATION RATE: Sed Rate: 15 mm/hr (ref 0–16)

## 2021-10-19 LAB — LACTIC ACID, PLASMA: Lactic Acid, Venous: 0.8 mmol/L (ref 0.5–1.9)

## 2021-10-19 MED ORDER — LIDOCAINE-EPINEPHRINE (PF) 2 %-1:200000 IJ SOLN
20.0000 mL | Freq: Once | INTRAMUSCULAR | Status: AC
  Administered 2021-10-19: 20 mL
  Filled 2021-10-19: qty 20

## 2021-10-19 MED ORDER — SULFAMETHOXAZOLE-TRIMETHOPRIM 800-160 MG PO TABS: 2.0000 | ORAL_TABLET | Freq: Two times a day (BID) | ORAL | 0 refills | Status: AC

## 2021-10-19 MED ORDER — OXYCODONE-ACETAMINOPHEN 5-325 MG PO TABS
1.0000 | ORAL_TABLET | ORAL | Status: DC | PRN
  Administered 2021-10-19: 1 via ORAL
  Filled 2021-10-19: qty 1

## 2021-10-19 MED ORDER — SULFAMETHOXAZOLE-TRIMETHOPRIM 800-160 MG PO TABS
2.0000 | ORAL_TABLET | Freq: Once | ORAL | Status: AC
  Administered 2021-10-19: 2 via ORAL
  Filled 2021-10-19: qty 2

## 2021-10-19 NOTE — ED Provider Notes (Signed)
[facility] EMERGENCY DEPARTMENT Provider Note   CSN: [id number] Arrival date & time: 10/19/21  1429     History  Chief Complaint  Patient presents with   Knee Pain    Ricky [facility] is a 61 y.o. male w/ HTN, history DVT on Eliquis, chronic low back pain, HLD, insomnia, ED, history of left quadriceps tendon rupture, OSA, tobacco abuse, who presents with left knee pain.  Patient reports approximately 1 week of worsening left knee pain and swelling with redness and warmth.  Patient has a no history of similar but states that when he was in high school he had multiple effusions of the knee that required periodic drainage of fluid.  He was seen at the [facility] at his PCP today and was sent here for evaluation of the septic joint.  Denies any fevers or chills.  Endorses pain with passive range of motion and is able to ambulate but states it is painful.  Takes Eliquis twice daily. Patient has had quadriceps tendon repair of the left knee but has no hardware in that knee. Had a normal white blood cell count at the [place] today and was hemodynamically stable. 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  Patient has been compliant with his Eliquis."  X-ray demonstrated "marked soft tissue swelling anteriorly and inferior to the patella which may represent bursitis or cellulitis no acute bony abnormality."   Knee Pain      Home Medications Prior to Admission medications   Medication Sig Start Date End Date Taking? Authorizing Provider  sulfamethoxazole-trimethoprim (BACTRIM DS) 800-160 MG tablet Take 2 tablets by mouth 2 (two) times daily for 7 days. 10/19/21 10/26/21 Yes Loetta Rough, MD  amLODipine (NORVASC) 10 MG tablet Take by mouth.    [provider]  atorvastatin (LIPITOR) 40 MG tablet Take by mouth. 03/06/17   [provider]   diphenhydrAMINE (BENADRYL) 25 MG tablet Take 25 mg by mouth daily.    [provider]  hydrochlorothiazide (HYDRODIURIL) 50 MG tablet Take 50 mg by mouth daily.    [provider]  hydroquinone 4 % cream Apply topically.    [provider]  losartan (COZAAR) 25 MG tablet Take by mouth. 03/06/17   [provider]      Allergies    Patient has no known allergies.    Review of Systems   Review of Systems Review of systems negative for fever.  A 10 point review of systems was performed and is negative unless otherwise reported in HPI.  Physical Exam Updated Vital Signs BP (!) 159/104   Pulse 63   Temp 98.5 F (36.9 C) (Oral)   Resp 18   SpO2 98%  Physical Exam General: Normal appearing male, sitting up in bed.  HEENT: Sclera anicteric, MMM, trachea midline, neck supple Cardiology: RRR, no murmurs/rubs/gallops. BL radial and DP pulses equal bilaterally.  Resp: Normal respiratory rate and effort. CTAB, no wheezes, rhonchi, crackles.  Abd: Soft, non-tender, non-distended. No rebound tenderness or guarding.  GU: Deferred. MSK: Joint effusion of L knee. Superficial area of redness, warmth, and swelling over anterior inferior aspect of joint. No warmth/erythema over superior anterior aspect of joint. TTP diffusely throughout joint especially inferior margin. No peripheral edema or signs of trauma. Longitudinal scar anterior L knee. Pain with passive and active ROM.  Skin: warm, dry. No rashes or lesions. Neuro:  A&Ox4, CNs II-XII grossly intact. MAEs. Sensation grossly intact.  Psych: Normal mood and affect.   ED Results / Procedures / Treatments   Labs (all labs ordered are listed, but only abnormal results are displayed) Labs Reviewed  BASIC METABOLIC PANEL - Abnormal; Notable for the following components:      Result Value   Sodium 134 (*)    Glucose, Bld 103 (*)    All other components within normal limits  C-REACTIVE PROTEIN - Abnormal; Notable  for the following components:   CRP 10.6 (*)    All other components within normal limits  SYNOVIAL CELL COUNT + DIFF, W/ CRYSTALS - Abnormal; Notable for the following components:   Color, Synovial PINK (*)    Appearance-Synovial CLOUDY (*)    Neutrophil, Synovial 39 (*)    Lymphocytes-Synovial Fld 42 (*)    Monocyte-Macrophage-Synovial Fluid 5 (*)    All other components within normal limits  BODY FLUID CULTURE W GRAM STAIN  CBC WITH DIFFERENTIAL/PLATELET  LACTIC ACID, PLASMA  SEDIMENTATION RATE    EKG None  Radiology DG Knee Complete 4 Views Left  Result Date: 10/19/2021 CLINICAL DATA:  Pain and swelling. The patient reports prior knee surgery in 2014. EXAM: LEFT KNEE - COMPLETE 4+ VIEW COMPARISON:  None Available. FINDINGS: No evidence of fracture, dislocation, or joint effusion. Degenerative changes are noted at the insertion of the patellar tendon and quadriceps tendon. There is soft tissue swelling anterior to the knee and patella as well as superior to the patella. IMPRESSION: Soft tissue swelling around the knee. No acute fracture or joint effusion. Electronically Signed   By: Romona Curls M.D.   On: 10/19/2021 16:37    Procedures .Joint Aspiration/Arthrocentesis  Date/Time: 10/19/2021 7:10 PM  Performed by: Loetta Rough, MD Authorized by: Loetta Rough, MD   Consent:    Consent obtained:  Written   Consent given by:  Patient   Risks, benefits, and alternatives were discussed: yes     Risks discussed:  Bleeding, infection, pain, poor cosmetic result, nerve damage and incomplete drainage   Alternatives discussed:  No treatment Universal protocol:    Procedure explained and questions answered to patient or proxy's satisfaction: yes     Relevant documents present and verified: yes     Test results available: yes     Imaging studies available: yes     Required blood products, implants, devices, and special equipment available: yes     Site/side marked: yes      Immediately prior to procedure, a time out was called: yes     Patient identity confirmed:  Verbally with patient Location:    Location:  Knee   Knee:  L knee Anesthesia:    Anesthesia method:  Local infiltration   Local anesthetic:  Lidocaine 2% WITH epi Procedure details:    Ultrasound guidance: no     Approach: superior lateral approach.   Aspirate amount:  4 cc   Aspirate characteristics:  Blood-tinged and yellow   Steroid injected: no     Specimen collected: yes   Post-procedure details:    Dressing:  Adhesive bandage   Procedure completion:  Tolerated well, no immediate complications     Medications Ordered in ED Medications  oxyCODONE-acetaminophen (PERCOCET/ROXICET) 5-325 MG per tablet 1 tablet (1 tablet Oral Given 10/19/21 1550)  lidocaine-EPINEPHrine (XYLOCAINE W/EPI) 2 %-1:200000 (PF) injection 20 mL (20 mLs Infiltration Given by Other 10/19/21 1816)  sulfamethoxazole-trimethoprim (BACTRIM DS) 800-160 MG per tablet 2 tablet (2  tablets Oral Given 10/19/21 2357)    ED Course/ Medical Decision Making/ A&P Clinical Course as of 10/20/21 0008  Thu Oct 19, 2021  2114 Lactic Acid, Venous: 0.8 [HN]  2114 CRP(!): 10.6 [HN]  2114 Sed Rate: 15 [HN]  2114 WBC: 10.1 [HN]  2316 Crystals, Fluid: NO CRYSTALS SEEN [HN]  2317 Gram Stain: NO WBC SEEN NO ORGANISMS SEEN Performed at [facility], [address] [place], [place] [address]  [HN]  2317 WBC, Synovial: 39 [HN]  2318 Patient with no white count, sed rate 15, CRP 10.6, and 39 WBCs on synovial fluid assessment, unlikely to be septic arthritis. No evidence of crystals. Culture pending.  [HN]  2357 Patient's pressures in 150s-170s systolic. States that he does take HTN medications at home and took them today. Advised to discuss this with his PCP when he sees them.  [HN]    Clinical Course User Index [HN] Loetta Rough, MD                           Medical Decision Making Amount and/or Complexity of Data  Reviewed Labs:  Decision-making details documented in ED Course.  Risk Prescription drug management.   Patient is overall well-appearing and HDS.   Will evaluate for septic arthritis with an arthrocentesis. Patient is given a written consent. Consider cellulitis of overlying skin given erythema of portion of leg and will tap joint above this area, in the superior lateral approach. Consider alternatively gout vs pseudogout, inflammatory effusion, prepatellar bursitis vs septic bursitis.   Lab w/u listed above, likely not septic arthritis. D/t concern for cellulitis vs septic bursitis, will give patient 2 tablets BID of bactrim x 7 days as o/p. Instructed to f/u with PCP within one week for reevaluate. Instructed to rest, ice, and elevate the joint as well. Culture is pending and patient will be notified if it returns positive.   Patient and wife at bedside understand discharge instructions and return precautions. All questions answered to patient's satisfaction.           Final Clinical Impression(s) / ED Diagnoses Final diagnoses:  Acute pain of left knee    Rx / DC Orders ED Discharge Orders          Ordered    sulfamethoxazole-trimethoprim (BACTRIM DS) 800-160 MG tablet  2 times daily        10/19/21 2346              Loetta Rough, MD 10/20/21 0008

## 2021-10-19 NOTE — Discharge Instructions (Addendum)
You were seen at [facility] Emergency Department. You were sent from the [place] with concern for a septic joint. We did a fluid tap of the knee which resulted negative for this issue. It is possible you have a skin infection or an infection of the bursa of the joint. For this we will treat you with 7 days of Bactrim to take orally twice per day. Please follow up within one week with your primary care physician at the [facility] to ensure you are improving. If you do not improve within a few days, or if your pain worsens, you develop a fever, you cannot walk, you develop numbness/tingling, or you have any other questions or concerns, please do not hesitate to call 911 or return to the ED.   Please discuss your hypertension with your PCP when you see them as well.

## 2021-10-19 NOTE — ED Provider Triage Note (Signed)
Emergency Medicine Provider Triage Evaluation Note  Ricky Peck , a 61 y.o. male  was evaluated in triage.  Pt complains of knee pain. L knee pain x 1 week.  Having difficulty bearing weight.  Pain radiates to thigh, no fever, no trauma.  Prior left knee surgery in remote past.  Seen by PCP and sent here with concerns for septic joint  Review of Systems  Positive: As above Negative: As above  Physical Exam  BP (!) 174/110 (BP Location: Left Arm)   Pulse (!) 104   Temp 99.7 F (37.6 C) (Oral)   Resp 16   SpO2 96%  Gen:   Awake, no distress   Resp:  Normal effort  MSK:   Moves extremities without difficulty  Other:    Medical Decision Making  Medically screening exam initiated at 3:41 PM.  Appropriate orders placed.  Audree Bane was informed that the remainder of the evaluation will be completed by another provider, this initial triage assessment does not replace that evaluation, and the importance of remaining in the ED until their evaluation is complete.  Infected bursitis vs septic joint of L knee.     Fayrene Helper, PA-C 10/19/21 1549

## 2021-10-19 NOTE — ED Provider Notes (Incomplete)
[name] EMERGENCY DEPARTMENT Provider Note   CSN: [id number] Arrival date & time: 10/19/21  1429     History {Add pertinent medical, surgical, social history, OB history to HPI:1} Chief Complaint  Patient presents with  . Knee Pain    Ricky Peck is a 61 y.o. male w/ HTN,   Patient reports approximately 1 week of worsening left knee pain and swelling with redness and warmth.  Patient has a no history of similar but states that when he was in high school he had multiple effusions of the knee that required periodic drainage of fluid.  He was seen at the [facility] at his PCP today and was sent here for evaluation of the septic joint.  Denies any fevers or chills.  Endorses pain with passive range of motion and is able to ambulate but states it is painful. Patient has had quadriceps tendon repair of the left knee but has no hardware in that knee. Had a normal white blood cell count at the [place] today and was hemodynamically stable. 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  Patient has been compliant with his Eliquis."  X-ray demonstrated "marked soft tissue swelling anteriorly and inferior to the patella which may represent bursitis or cellulitis no acute bony abnormality."   Knee Pain      Home Medications Prior to Admission medications   Medication Sig Start Date End Date Taking? Authorizing Provider  sulfamethoxazole-trimethoprim (BACTRIM DS) 800-160 MG tablet Take 2 tablets by mouth 2 (two) times daily for 7 days. 10/19/21 10/26/21 Yes Loetta Rough, MD  amLODipine (NORVASC) 10 MG tablet Take by mouth.    [provider]  atorvastatin (LIPITOR) 40 MG tablet Take by mouth. 03/06/17   [provider]  diphenhydrAMINE (BENADRYL) 25 MG tablet Take 25 mg by mouth daily.    [provider]  hydrochlorothiazide  (HYDRODIURIL) 50 MG tablet Take 50 mg by mouth daily.    [provider]  hydroquinone 4 % cream Apply topically.    [provider]  losartan (COZAAR) 25 MG tablet Take by mouth. 03/06/17   [provider]      Allergies    Patient has no known allergies.    Review of Systems   Review of Systems  Physical Exam Updated Vital Signs BP (!) 160/107   Pulse 86   Temp 98.5 F (36.9 C) (Oral)   Resp 18   SpO2 94%  Physical Exam General: Normal appearing male, sitting up in bed.  HEENT: Sclera anicteric, MMM, trachea midline, neck supple Cardiology: RRR, no murmurs/rubs/gallops. BL radial and DP pulses equal bilaterally.  Resp: Normal respiratory rate and effort. CTAB, no wheezes, rhonchi, crackles.  Abd: Soft, non-tender, non-distended. No rebound tenderness or guarding.  GU: Deferred. MSK: Joint effusion of L knee. Superficial area of redness, warmth over anterior inferior aspect of joint. No warmth/erythema over superior anterior aspect of joint. TTP diffusely throughout joint especially inferior margin. No peripheral edema or signs of trauma. Longitudinal scar anterior L knee.  Skin: warm, dry. No rashes or lesions. Neuro: A&Ox4, CNs II-XII grossly intact. MAEs. Sensation grossly intact.  Psych: Normal mood and affect.   ED Results / Procedures / Treatments   Labs (all labs ordered are listed, but only abnormal results are displayed) Labs Reviewed  BASIC METABOLIC PANEL - Abnormal; Notable for the following components:  Result Value   Sodium 134 (*)    Glucose, Bld 103 (*)    All other components within normal limits  C-REACTIVE PROTEIN - Abnormal; Notable for the following components:   CRP 10.6 (*)    All other components within normal limits  SYNOVIAL CELL COUNT + DIFF, W/ CRYSTALS - Abnormal; Notable for the following components:   Color, Synovial PINK (*)    Appearance-Synovial CLOUDY (*)    Neutrophil, Synovial 39 (*)     Lymphocytes-Synovial Fld 42 (*)    Monocyte-Macrophage-Synovial Fluid 5 (*)    All other components within normal limits  BODY FLUID CULTURE W GRAM STAIN  CBC WITH DIFFERENTIAL/PLATELET  LACTIC ACID, PLASMA  SEDIMENTATION RATE    EKG None  Radiology DG Knee Complete 4 Views Left  Result Date: 10/19/2021 CLINICAL DATA:  Pain and swelling. The patient reports prior knee surgery in 2014. EXAM: LEFT KNEE - COMPLETE 4+ VIEW COMPARISON:  None Available. FINDINGS: No evidence of fracture, dislocation, or joint effusion. Degenerative changes are noted at the insertion of the patellar tendon and quadriceps tendon. There is soft tissue swelling anterior to the knee and patella as well as superior to the patella. IMPRESSION: Soft tissue swelling around the knee. No acute fracture or joint effusion. Electronically Signed   By: Romona Curls M.D.   On: 10/19/2021 16:37    Procedures .Joint Aspiration/Arthrocentesis  Date/Time: 10/19/2021 7:10 PM  Performed by: Loetta Rough, MD Authorized by: Loetta Rough, MD   Consent:    Consent obtained:  Written   Consent given by:  Patient   Risks, benefits, and alternatives were discussed: yes     Risks discussed:  Bleeding, infection, pain, poor cosmetic result, nerve damage and incomplete drainage   Alternatives discussed:  No treatment Universal protocol:    Patient identity confirmed:  Verbally with patient Location:    Location:  Knee   Knee:  L knee Anesthesia:    Anesthesia method:  Local infiltration   Local anesthetic:  Lidocaine 2% WITH epi Procedure details:    Ultrasound guidance: no     Approach:  Lateral   {Document cardiac monitor, telemetry assessment procedure when appropriate:1}  Medications Ordered in ED Medications  oxyCODONE-acetaminophen (PERCOCET/ROXICET) 5-325 MG per tablet 1 tablet (1 tablet Oral Given 10/19/21 1550)  sulfamethoxazole-trimethoprim (BACTRIM DS) 800-160 MG per tablet 2 tablet (has no administration in  time range)  lidocaine-EPINEPHrine (XYLOCAINE W/EPI) 2 %-1:200000 (PF) injection 20 mL (20 mLs Infiltration Given by Other 10/19/21 1816)    ED Course/ Medical Decision Making/ A&P Clinical Course as of 10/19/21 2357  Thu Oct 19, 2021  2114 Lactic Acid, Venous: 0.8 [HN]  2114 CRP(!): 10.6 [HN]  2114 Sed Rate: 15 [HN]  2114 WBC: 10.1 [HN]  2316 Crystals, Fluid: NO CRYSTALS SEEN [HN]  2317 Gram Stain: NO WBC SEEN NO ORGANISMS SEEN Performed at [facility], [address], [place], [place] [address]  [HN]  2317 WBC, Synovial: 39 [HN]  2318 Patient with no white count, sed rate 15, CRP 10.6, and 39 WBCs on synovial fluid assessment, unlikely to be septic arthritis. No evidence of crystals. Culture pending.  [HN]  2357 Patient's pressures in 150s-170s systolic. States that he does take HTN medications at home and took them today. Advised to discuss this with his PCP when he sees them.  [HN]    Clinical Course User Index [HN] Loetta Rough, MD  Medical Decision Making Amount and/or Complexity of Data Reviewed Labs:  Decision-making details documented in ED Course.  Risk Prescription drug management.   Patient is overall well-appearing and HDS.   Will evaluate for septic arthritis with an arthrocentesis. Patient is given a written consent. Consider cellulitis of overlying skin given erythema of portion of leg and will tap joint above this area. Consider alternatively gout vs pseudogout, inflammatory effusion, prepatellar bursitis vs septic bursitis.   Lab w/u listed above, likely not septic arthritis. D/t concern for cellulitis vs septic bursitis, will give patient 2 tablets BID of bactrim x 7 days as o/p. Instructed to f/u with PCP within one week for reevaluate. Instructed to rest, ice, and elevate the joint as well. Culture is pending and patient will be notified if it returns positive.   Patient and wife at bedside understand discharge instructions  and return precautions. All questions answered to patient's satisfaction.     {Document critical care time when appropriate:1} {Document review of labs and clinical decision tools ie heart score, Chads2Vasc2 etc:1}  {Document your independent review of radiology images, and any outside records:1} {Document your discussion with family members, caretakers, and with consultants:1} {Document social determinants of health affecting pt's care:1} {Document your decision making why or why not admission, treatments were needed:1} Final Clinical Impression(s) / ED Diagnoses Final diagnoses:  Acute pain of left knee    Rx / DC Orders ED Discharge Orders          Ordered    sulfamethoxazole-trimethoprim (BACTRIM DS) 800-160 MG tablet  2 times daily        10/19/21 2346

## 2021-10-19 NOTE — ED Triage Notes (Signed)
Pt with left knee pain sent by [facility] for eval for septic joint.  Pt has had multiple tests today at the [place] and has those with him.  Pt states he asked for pain medication there but was told he needed to wait until he was seen here.  Pt states he cannot get up and down well and has had increasing pain since Saturday but it started last week.

## 2021-10-20 NOTE — ED Notes (Signed)
Discharge instructions and follow up care were discussed with the Pt. Pt verbalized understanding and stated no questions, comments, or concerns. Pt left with family and in wheelchair.

## 2021-10-23 LAB — BODY FLUID CULTURE W GRAM STAIN
Culture: NO GROWTH
Gram Stain: NONE SEEN

## 2022-04-09 ENCOUNTER — Ambulatory Visit
Admission: EM | Admit: 2022-04-09 | Discharge: 2022-04-09 | Disposition: A | Discharge status: Home / Self Care | Payer: No Typology Code available for payment source | Attending: Nurse Practitioner | Admitting: Nurse Practitioner

## 2022-04-09 DIAGNOSIS — J069 Acute upper respiratory infection, unspecified: Secondary | ICD-10-CM | POA: Diagnosis not present

## 2022-04-09 DIAGNOSIS — Z1152 Encounter for screening for COVID-19: Secondary | ICD-10-CM | POA: Diagnosis not present

## 2022-04-09 DIAGNOSIS — R350 Frequency of micturition: Secondary | ICD-10-CM | POA: Insufficient documentation

## 2022-04-09 LAB — POCT URINALYSIS DIP (MANUAL ENTRY)
Blood, UA: NEGATIVE
Glucose, UA: NEGATIVE mg/dL
Leukocytes, UA: NEGATIVE
Nitrite, UA: NEGATIVE
Protein Ur, POC: 100 mg/dL — AB
Spec Grav, UA: 1.025 (ref 1.010–1.025)
Urobilinogen, UA: 1 E.U./dL
pH, UA: 5.5 (ref 5.0–8.0)

## 2022-04-09 MED ORDER — BENZONATATE 100 MG PO CAPS: 100.0000 mg | ORAL_CAPSULE | Freq: Three times a day (TID) | ORAL | 0 refills | Status: DC | PRN

## 2022-04-09 NOTE — ED Provider Notes (Signed)
[facility]    CSN: [id number] Arrival date & time: 04/09/22  1119      History   Chief Complaint Chief Complaint  Patient presents with   Generalized Body Aches   Urinary Frequency    HPI Ricky Peck is a 62 y.o. male.   Patient presents today for 1 day history of bodyaches, chills, slight dry cough, 1 episode of vomiting last night, and fatigue.  He also endorses urinary frequency last night that has resolved this morning.  No fevers, shortness of breath or chest pain, chest congestion, nasal congestion, runny nose or postnasal drainage, sore throat, headache, ear pain, abdominal pain, nausea, diarrhea, decreased appetite, or loss of taste/smell.  No new rash.  He also denies dysuria, new back or flank pain, penile discharge, urinary urgency, new urinary incontinence, foul urinary odor, and hematuria.  Reports history of UTI.  Reports he does not think this is a UTI.  No known contacts with similar symptoms.  Has not take anything for symptoms so far.  Reports his wife sent him here today for COVID test.    Past Medical History:  Diagnosis Date   Allergy    Arthritis    Hyperlipidemia    Hypertension    Sleep apnea    cpap    There are no problems to display for this patient.   Past Surgical History:  Procedure Laterality Date   quadricep surgery  2014       Home Medications    Prior to Admission medications   Medication Sig Start Date End Date Taking? Authorizing Provider  amLODipine (NORVASC) 10 MG tablet Take by mouth.   Yes [provider]  atorvastatin (LIPITOR) 40 MG tablet Take by mouth. 03/06/17  Yes [provider]  benzonatate (TESSALON) 100 MG capsule Take 1 capsule (100 mg total) by mouth 3 (three) times daily as needed for cough. Do not take with alcohol or while driving or operating heavy machinery.  May cause drowsiness. 04/09/22  Yes Noemi Chapel A, NP  hydrochlorothiazide (HYDRODIURIL) 50 MG tablet Take 50 mg  by mouth daily.   Yes [provider]  hydroquinone 4 % cream Apply topically.   Yes [provider]  losartan (COZAAR) 25 MG tablet Take by mouth. 03/06/17  Yes [provider]  diphenhydrAMINE (BENADRYL) 25 MG tablet Take 25 mg by mouth daily.    [provider]    Family History Family History  Problem Relation Age of Onset   Colon cancer Neg Hx    Colon polyps Neg Hx    Esophageal cancer Neg Hx    Rectal cancer Neg Hx    Stomach cancer Neg Hx     Social History Social History   Tobacco Use   Smoking status: Former    Types: Cigarettes    Quit date: 02/25/2017    Years since quitting: 5.1   Smokeless tobacco: Never  Vaping Use   Vaping Use: Never used  Substance Use Topics   Alcohol use: Yes    Comment: occasionally   Drug use: No     Allergies   Patient has no known allergies.   Review of Systems Review of Systems Per HPI  Physical Exam Triage Vital Signs ED Triage Vitals  Enc Vitals Group     BP 04/09/22 1310 (!) 131/91     Pulse Rate 04/09/22 1310 76     Resp 04/09/22 1310 19     Temp 04/09/22 1310 98.8 F (  37.1 C)     Temp Source 04/09/22 1310 Oral     SpO2 04/09/22 1310 99 %     Weight --      Height --      Head Circumference --      Peak Flow --      Pain Score 04/09/22 1246 0     Pain Loc --      Pain Edu? --      Excl. in [place]? --    No data found.  Updated Vital Signs BP (!) 131/91 (BP Location: Right Arm)   Pulse 76   Temp 98.8 F (37.1 C) (Oral)   Resp 19   SpO2 99%   Visual Acuity Right Eye Distance:   Left Eye Distance:   Bilateral Distance:    Right Eye Near:   Left Eye Near:    Bilateral Near:     Physical Exam Vitals and nursing note reviewed.  Constitutional:      General: He is not in acute distress.    Appearance: Normal appearance. He is not ill-appearing or toxic-appearing.  HENT:     Head: Normocephalic and atraumatic.     Right Ear: Tympanic membrane, ear canal and  external ear normal.     Left Ear: Tympanic membrane, ear canal and external ear normal.     Nose: No congestion or rhinorrhea.     Mouth/Throat:     Mouth: Mucous membranes are moist.     Pharynx: Oropharynx is clear. No oropharyngeal exudate or posterior oropharyngeal erythema.  Eyes:     General: No scleral icterus.    Extraocular Movements: Extraocular movements intact.  Cardiovascular:     Rate and Rhythm: Normal rate and regular rhythm.  Pulmonary:     Effort: Pulmonary effort is normal. No respiratory distress.     Breath sounds: Normal breath sounds. No wheezing, rhonchi or rales.  Abdominal:     General: Abdomen is flat. Bowel sounds are normal. There is no distension.     Palpations: Abdomen is soft.     Tenderness: There is no abdominal tenderness. There is no right CVA tenderness, left CVA tenderness or guarding.  Musculoskeletal:     Cervical back: Normal range of motion and neck supple.  Lymphadenopathy:     Cervical: No cervical adenopathy.  Skin:    General: Skin is warm and dry.     Coloration: Skin is not jaundiced or pale.     Findings: No erythema or rash.  Neurological:     Mental Status: He is alert and oriented to person, place, and time.  Psychiatric:        Behavior: Behavior is cooperative.      UC Treatments / Results  Labs (all labs ordered are listed, but only abnormal results are displayed) Labs Reviewed  POCT URINALYSIS DIP (MANUAL ENTRY) - Abnormal; Notable for the following components:      Result Value   Clarity, UA hazy (*)    Bilirubin, UA small (*)    Ketones, POC UA trace (5) (*)    Protein Ur, POC =100 (*)    All other components within normal limits  SARS CORONAVIRUS 2 (TAT 6-24 HRS)  URINE CULTURE    EKG   Radiology No results found.  Procedures Procedures (including critical care time)  Medications Ordered in UC Medications - No data to display  Initial Impression / Assessment and Plan / UC Course  I have reviewed  the triage vital signs  and the nursing notes.  Pertinent labs & imaging results that were available during my care of the patient were reviewed by me and considered in my medical decision making (see chart for details).   Patient is well-appearing, normotensive, afebrile, not tachycardic, not tachypneic, oxygenating well on room air.    1. Viral URI with cough 2. Encounter for screening for COVID-19 Suspect viral etiology COVID-19 testing obtained Supportive care discussed with patient Patient is a candidate for molnupiravir if he tests positive Start Tessalon Perles as needed for dry cough ER and return precautions discussed with patient  3. Urinary frequency Urinalysis today without clear signs of infection including no nitrites Urine culture is pending given urinary frequency, history of UTI Will need to treat as indicated ER precautions discussed with patient  The patient was given the opportunity to ask questions.  All questions answered to their satisfaction.  The patient is in agreement to this plan.    Final Clinical Impressions(s) / UC Diagnoses   Final diagnoses:  Viral URI with cough  Urinary frequency  Encounter for screening for COVID-19     Discharge Instructions      You have a viral upper respiratory infection.  Symptoms should improve over the next week to 10 days.  If you develop chest pain or shortness of breath, go to the emergency room.  We have tested you today for COVID-19.  You will see the results in Mychart and we will call you with positive results.  Please stay home and isolate until you are aware of the results.    Some things that can make you feel better are: - Increased rest - Increasing fluid with water/sugar free electrolytes - Acetaminophen and ibuprofen as needed for fever/pain - Salt water gargling, chloraseptic spray and throat lozenges for sore throat - OTC guaifenesin (Mucinex) 600 mg twice daily for congestion - Saline sinus flushes  or a neti pot for congestion - Humidifying the air -Tessalon Perles every 8 hours as needed for dry cough  The urine sample today does not suggest a UTI.  We are sending for a urine culture and will call you if this comes back abnormal in a few days.     ED Prescriptions     Medication Sig Dispense Auth. Provider   benzonatate (TESSALON) 100 MG capsule Take 1 capsule (100 mg total) by mouth 3 (three) times daily as needed for cough. Do not take with alcohol or while driving or operating heavy machinery.  May cause drowsiness. 21 capsule Eulogio Bear, NP      PDMP not reviewed this encounter.   Eulogio Bear, NP 04/09/22 [phone]

## 2022-04-09 NOTE — Discharge Instructions (Addendum)
You have a viral upper respiratory infection.  Symptoms should improve over the next week to 10 days.  If you develop chest pain or shortness of breath, go to the emergency room.  We have tested you today for COVID-19.  You will see the results in Mychart and we will call you with positive results.  Please stay home and isolate until you are aware of the results.    Some things that can make you feel better are: - Increased rest - Increasing fluid with water/sugar free electrolytes - Acetaminophen and ibuprofen as needed for fever/pain - Salt water gargling, chloraseptic spray and throat lozenges for sore throat - OTC guaifenesin (Mucinex) 600 mg twice daily for congestion - Saline sinus flushes or a neti pot for congestion - Humidifying the air -Tessalon Perles every 8 hours as needed for dry cough  The urine sample today does not suggest a UTI.  We are sending for a urine culture and will call you if this comes back abnormal in a few days.

## 2022-04-09 NOTE — ED Triage Notes (Signed)
Body aches, chills, fatigue yesterday would like covid test. Urinary frequency that started yesterday as well. No burning or lower back or abdominal pain. Has had a UTI in the past.

## 2022-04-10 ENCOUNTER — Telehealth (HOSPITAL_COMMUNITY): Payer: Self-pay | Admitting: Emergency Medicine

## 2022-04-10 LAB — URINE CULTURE: Culture: NO GROWTH

## 2022-04-10 LAB — SARS CORONAVIRUS 2 (TAT 6-24 HRS): SARS Coronavirus 2: POSITIVE — AB

## 2022-04-10 MED ORDER — MOLNUPIRAVIR EUA 200MG CAPSULE: 4.0000 | ORAL_CAPSULE | Freq: Two times a day (BID) | ORAL | 0 refills | Status: DC

## 2022-04-11 ENCOUNTER — Telehealth (HOSPITAL_COMMUNITY): Payer: Self-pay | Admitting: Emergency Medicine

## 2022-04-11 NOTE — Telephone Encounter (Signed)
Awaiting return call to confirm patient's pharmacy of choice
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
## 2022-05-09 ENCOUNTER — Other Ambulatory Visit: Payer: Self-pay

## 2022-05-09 ENCOUNTER — Inpatient Hospital Stay (HOSPITAL_COMMUNITY)
Admission: EM | Admit: 2022-05-09 | Discharge: 2022-05-11 | DRG: 322 | Disposition: A | Discharge status: Home / Self Care | Payer: No Typology Code available for payment source | Attending: Cardiovascular Disease | Admitting: Cardiovascular Disease

## 2022-05-09 ENCOUNTER — Emergency Department (HOSPITAL_COMMUNITY): Payer: No Typology Code available for payment source

## 2022-05-09 ENCOUNTER — Encounter (HOSPITAL_COMMUNITY): Payer: Self-pay

## 2022-05-09 DIAGNOSIS — I2510 Atherosclerotic heart disease of native coronary artery without angina pectoris: Secondary | ICD-10-CM | POA: Insufficient documentation

## 2022-05-09 DIAGNOSIS — Z888 Allergy status to other drugs, medicaments and biological substances status: Secondary | ICD-10-CM

## 2022-05-09 DIAGNOSIS — I214 Non-ST elevation (NSTEMI) myocardial infarction: Secondary | ICD-10-CM | POA: Diagnosis not present

## 2022-05-09 DIAGNOSIS — G4733 Obstructive sleep apnea (adult) (pediatric): Secondary | ICD-10-CM | POA: Diagnosis present

## 2022-05-09 DIAGNOSIS — Z79899 Other long term (current) drug therapy: Secondary | ICD-10-CM

## 2022-05-09 DIAGNOSIS — Z955 Presence of coronary angioplasty implant and graft: Secondary | ICD-10-CM

## 2022-05-09 DIAGNOSIS — I252 Old myocardial infarction: Secondary | ICD-10-CM

## 2022-05-09 DIAGNOSIS — Z86718 Personal history of other venous thrombosis and embolism: Secondary | ICD-10-CM

## 2022-05-09 DIAGNOSIS — I4510 Unspecified right bundle-branch block: Secondary | ICD-10-CM | POA: Diagnosis present

## 2022-05-09 DIAGNOSIS — F17290 Nicotine dependence, other tobacco product, uncomplicated: Secondary | ICD-10-CM | POA: Diagnosis present

## 2022-05-09 DIAGNOSIS — E785 Hyperlipidemia, unspecified: Secondary | ICD-10-CM | POA: Diagnosis present

## 2022-05-09 DIAGNOSIS — I10 Essential (primary) hypertension: Secondary | ICD-10-CM | POA: Insufficient documentation

## 2022-05-09 DIAGNOSIS — E876 Hypokalemia: Secondary | ICD-10-CM | POA: Diagnosis present

## 2022-05-09 DIAGNOSIS — Z7901 Long term (current) use of anticoagulants: Secondary | ICD-10-CM

## 2022-05-09 LAB — BASIC METABOLIC PANEL
Anion gap: 15 (ref 5–15)
BUN: 10 mg/dL (ref 8–23)
CO2: 23 mmol/L (ref 22–32)
Calcium: 10 mg/dL (ref 8.9–10.3)
Chloride: 95 mmol/L — ABNORMAL LOW (ref 98–111)
Creatinine, Ser: 1.11 mg/dL (ref 0.61–1.24)
GFR, Estimated: >60 mL/min (ref >60)
Glucose, Bld: 134 mg/dL — ABNORMAL HIGH (ref 70–99)
Potassium: 4 mmol/L (ref 3.5–5.1)
Sodium: 133 mmol/L — ABNORMAL LOW (ref 135–145)

## 2022-05-09 LAB — CBC
HCT: 52.7 % — ABNORMAL HIGH (ref 39.0–52.0)
Hemoglobin: 17.2 g/dL — ABNORMAL HIGH (ref 13.0–17.0)
MCH: 28.9 pg (ref 26.0–34.0)
MCHC: 32.6 g/dL (ref 30.0–36.0)
MCV: 88.4 fL (ref 80.0–100.0)
Platelets: 211 10*3/uL (ref 150–400)
RBC: 5.96 MIL/uL — ABNORMAL HIGH (ref 4.22–5.81)
RDW: 13.3 % (ref 11.5–15.5)
WBC: 7.2 10*3/uL (ref 4.0–10.5)
nRBC: 0 % (ref 0.0–0.2)

## 2022-05-09 LAB — TROPONIN I (HIGH SENSITIVITY): Troponin I (High Sensitivity): 4434 ng/L (ref <18)

## 2022-05-09 MED ORDER — IOHEXOL 350 MG/ML SOLN
100.0000 mL | Freq: Once | INTRAVENOUS | Status: AC | PRN
  Administered 2022-05-09: 100 mL via INTRAVENOUS

## 2022-05-09 MED ORDER — MORPHINE SULFATE (PF) 4 MG/ML IV SOLN
4.0000 mg | Freq: Once | INTRAVENOUS | Status: AC
  Administered 2022-05-09: 4 mg via INTRAVENOUS
  Filled 2022-05-09: qty 1

## 2022-05-09 MED ORDER — ASPIRIN 81 MG PO CHEW
324.0000 mg | CHEWABLE_TABLET | Freq: Once | ORAL | Status: AC
  Administered 2022-05-09: 324 mg via ORAL
  Filled 2022-05-09: qty 4

## 2022-05-09 MED ORDER — NITROGLYCERIN 0.4 MG SL SUBL: 0.4000 mg | SUBLINGUAL_TABLET | SUBLINGUAL | Status: DC | PRN

## 2022-05-09 MED ORDER — NITROGLYCERIN IN D5W 200-5 MCG/ML-% IV SOLN
0.0000 ug/min | INTRAVENOUS | Status: DC
  Administered 2022-05-09: 5 ug/min via INTRAVENOUS
  Administered 2022-05-10: 50 ug/min via INTRAVENOUS
  Filled 2022-05-09 (×2): qty 250

## 2022-05-09 NOTE — ED Triage Notes (Signed)
Mid sternal chest pains beginning this morning while eating breakfast. Pain has intensified throughout the day rating it a 8/10.

## 2022-05-10 ENCOUNTER — Inpatient Hospital Stay (HOSPITAL_COMMUNITY)
Admission: EM | Disposition: A | Discharge status: Home / Self Care | Payer: Self-pay | Source: Home / Self Care | Attending: Cardiovascular Disease

## 2022-05-10 DIAGNOSIS — Z888 Allergy status to other drugs, medicaments and biological substances status: Secondary | ICD-10-CM | POA: Diagnosis not present

## 2022-05-10 DIAGNOSIS — I214 Non-ST elevation (NSTEMI) myocardial infarction: Secondary | ICD-10-CM | POA: Diagnosis present

## 2022-05-10 DIAGNOSIS — I252 Old myocardial infarction: Secondary | ICD-10-CM | POA: Diagnosis not present

## 2022-05-10 DIAGNOSIS — Z86718 Personal history of other venous thrombosis and embolism: Secondary | ICD-10-CM | POA: Diagnosis not present

## 2022-05-10 DIAGNOSIS — G4733 Obstructive sleep apnea (adult) (pediatric): Secondary | ICD-10-CM | POA: Diagnosis present

## 2022-05-10 DIAGNOSIS — E876 Hypokalemia: Secondary | ICD-10-CM | POA: Diagnosis present

## 2022-05-10 DIAGNOSIS — Z79899 Other long term (current) drug therapy: Secondary | ICD-10-CM | POA: Diagnosis not present

## 2022-05-10 DIAGNOSIS — I2510 Atherosclerotic heart disease of native coronary artery without angina pectoris: Secondary | ICD-10-CM | POA: Diagnosis present

## 2022-05-10 DIAGNOSIS — E785 Hyperlipidemia, unspecified: Secondary | ICD-10-CM | POA: Diagnosis present

## 2022-05-10 DIAGNOSIS — Z7901 Long term (current) use of anticoagulants: Secondary | ICD-10-CM | POA: Diagnosis not present

## 2022-05-10 DIAGNOSIS — F17290 Nicotine dependence, other tobacco product, uncomplicated: Secondary | ICD-10-CM | POA: Diagnosis present

## 2022-05-10 DIAGNOSIS — I10 Essential (primary) hypertension: Secondary | ICD-10-CM | POA: Diagnosis present

## 2022-05-10 DIAGNOSIS — I4510 Unspecified right bundle-branch block: Secondary | ICD-10-CM | POA: Diagnosis present

## 2022-05-10 HISTORY — PX: LEFT HEART CATH AND CORONARY ANGIOGRAPHY: CATH118249

## 2022-05-10 HISTORY — PX: CORONARY THROMBECTOMY: CATH118304

## 2022-05-10 HISTORY — PX: CORONARY STENT INTERVENTION: CATH118234

## 2022-05-10 LAB — CBC
HCT: 46.3 % (ref 39.0–52.0)
Hemoglobin: 14.8 g/dL (ref 13.0–17.0)
MCH: 28.5 pg (ref 26.0–34.0)
MCHC: 32 g/dL (ref 30.0–36.0)
MCV: 89 fL (ref 80.0–100.0)
Platelets: 192 10*3/uL (ref 150–400)
RBC: 5.2 MIL/uL (ref 4.22–5.81)
RDW: 13.4 % (ref 11.5–15.5)
WBC: 7.6 10*3/uL (ref 4.0–10.5)
nRBC: 0 % (ref 0.0–0.2)

## 2022-05-10 LAB — LIPID PANEL
Cholesterol: 250 mg/dL — ABNORMAL HIGH (ref 0–200)
HDL: 39 mg/dL — ABNORMAL LOW (ref >40)
LDL Cholesterol: 195 mg/dL — ABNORMAL HIGH (ref 0–99)
Total CHOL/HDL Ratio: 6.4 RATIO
Triglycerides: 78 mg/dL (ref <150)
VLDL: 16 mg/dL (ref 0–40)

## 2022-05-10 LAB — POCT ACTIVATED CLOTTING TIME
Activated Clotting Time: 271 seconds
Activated Clotting Time: 298 seconds
Activated Clotting Time: 287 seconds
Activated Clotting Time: 287 seconds
Activated Clotting Time: 266 seconds
Activated Clotting Time: 196 seconds
Activated Clotting Time: 179 seconds

## 2022-05-10 LAB — BASIC METABOLIC PANEL
Anion gap: 15 (ref 5–15)
BUN: 10 mg/dL (ref 8–23)
CO2: 22 mmol/L (ref 22–32)
Calcium: 9.7 mg/dL (ref 8.9–10.3)
Chloride: 97 mmol/L — ABNORMAL LOW (ref 98–111)
Creatinine, Ser: 0.93 mg/dL (ref 0.61–1.24)
GFR, Estimated: >60 mL/min (ref >60)
Glucose, Bld: 184 mg/dL — ABNORMAL HIGH (ref 70–99)
Potassium: 3.4 mmol/L — ABNORMAL LOW (ref 3.5–5.1)
Sodium: 134 mmol/L — ABNORMAL LOW (ref 135–145)

## 2022-05-10 LAB — HEPARIN LEVEL (UNFRACTIONATED): Heparin Unfractionated: 0.43 IU/mL (ref 0.30–0.70)

## 2022-05-10 LAB — HIV ANTIBODY (ROUTINE TESTING W REFLEX): HIV Screen 4th Generation wRfx: NONREACTIVE

## 2022-05-10 LAB — TSH: TSH: 3.36 u[IU]/mL (ref 0.350–4.500)

## 2022-05-10 LAB — TROPONIN I (HIGH SENSITIVITY)
Troponin I (High Sensitivity): 5513 ng/L (ref <18)
Troponin I (High Sensitivity): 23690 ng/L (ref <18)

## 2022-05-10 LAB — APTT: aPTT: 44 seconds — ABNORMAL HIGH (ref 24–36)

## 2022-05-10 SURGERY — LEFT HEART CATH AND CORONARY ANGIOGRAPHY
Anesthesia: LOCAL

## 2022-05-10 MED ORDER — NITROGLYCERIN 1 MG/10 ML FOR IR/CATH LAB
INTRA_ARTERIAL | Status: DC | PRN
  Administered 2022-05-10 (×2): 200 ug via INTRACORONARY

## 2022-05-10 MED ORDER — LOSARTAN POTASSIUM 50 MG PO TABS: 100.0000 mg | ORAL_TABLET | Freq: Every day | ORAL | Status: DC

## 2022-05-10 MED ORDER — ASPIRIN 81 MG PO CHEW
81.0000 mg | CHEWABLE_TABLET | Freq: Every day | ORAL | Status: DC
  Administered 2022-05-11: 81 mg via ORAL
  Filled 2022-05-10: qty 1

## 2022-05-10 MED ORDER — MIDAZOLAM HCL 2 MG/2ML IJ SOLN
INTRAMUSCULAR | Status: AC
  Filled 2022-05-10: qty 2

## 2022-05-10 MED ORDER — NITROGLYCERIN IN D5W 200-5 MCG/ML-% IV SOLN
INTRAVENOUS | Status: AC
  Filled 2022-05-10: qty 250

## 2022-05-10 MED ORDER — ACETAMINOPHEN 325 MG PO TABS
ORAL_TABLET | ORAL | Status: AC
  Filled 2022-05-10: qty 2

## 2022-05-10 MED ORDER — TICAGRELOR 90 MG PO TABS
ORAL_TABLET | ORAL | Status: AC
  Filled 2022-05-10: qty 2

## 2022-05-10 MED ORDER — TICAGRELOR 90 MG PO TABS
90.0000 mg | ORAL_TABLET | Freq: Two times a day (BID) | ORAL | Status: DC
  Administered 2022-05-11: 90 mg via ORAL
  Filled 2022-05-10: qty 1

## 2022-05-10 MED ORDER — HEPARIN SODIUM (PORCINE) 1000 UNIT/ML IJ SOLN
INTRAMUSCULAR | Status: AC
  Filled 2022-05-10: qty 10

## 2022-05-10 MED ORDER — ONDANSETRON HCL 4 MG/2ML IJ SOLN: 4.0000 mg | Freq: Four times a day (QID) | INTRAMUSCULAR | Status: DC | PRN

## 2022-05-10 MED ORDER — MIDAZOLAM HCL 2 MG/2ML IJ SOLN
INTRAMUSCULAR | Status: DC | PRN
  Administered 2022-05-10: 1 mg via INTRAVENOUS
  Administered 2022-05-10: 2 mg via INTRAVENOUS

## 2022-05-10 MED ORDER — LOSARTAN POTASSIUM 50 MG PO TABS
50.0000 mg | ORAL_TABLET | Freq: Every day | ORAL | Status: DC
  Administered 2022-05-10 – 2022-05-11 (×2): 50 mg via ORAL
  Filled 2022-05-10: qty 2
  Filled 2022-05-10: qty 1

## 2022-05-10 MED ORDER — SODIUM CHLORIDE 0.9 % WEIGHT BASED INFUSION: 3.0000 mL/kg/h | INTRAVENOUS | Status: DC

## 2022-05-10 MED ORDER — VERAPAMIL HCL 2.5 MG/ML IV SOLN
INTRAVENOUS | Status: DC | PRN
  Administered 2022-05-10: 10 mL via INTRA_ARTERIAL

## 2022-05-10 MED ORDER — SODIUM CHLORIDE 0.9 % WEIGHT BASED INFUSION: 1.0000 mL/kg/h | INTRAVENOUS | Status: DC

## 2022-05-10 MED ORDER — TICAGRELOR 90 MG PO TABS
ORAL_TABLET | ORAL | Status: DC | PRN
  Administered 2022-05-10: 180 mg via ORAL

## 2022-05-10 MED ORDER — LIDOCAINE HCL (PF) 1 % IJ SOLN
INTRAMUSCULAR | Status: AC
  Filled 2022-05-10: qty 30

## 2022-05-10 MED ORDER — HEPARIN (PORCINE) IN NACL 1000-0.9 UT/500ML-% IV SOLN
INTRAVENOUS | Status: DC | PRN
  Administered 2022-05-10 (×3): 500 mL

## 2022-05-10 MED ORDER — SODIUM CHLORIDE 0.9% FLUSH: 3.0000 mL | Freq: Two times a day (BID) | INTRAVENOUS | Status: DC

## 2022-05-10 MED ORDER — FENTANYL CITRATE (PF) 100 MCG/2ML IJ SOLN
INTRAMUSCULAR | Status: DC | PRN
  Administered 2022-05-10: 25 ug via INTRAVENOUS
  Administered 2022-05-10: 50 ug
  Administered 2022-05-10: 25 ug via INTRAVENOUS

## 2022-05-10 MED ORDER — CARVEDILOL 12.5 MG PO TABS
12.5000 mg | ORAL_TABLET | Freq: Two times a day (BID) | ORAL | Status: DC
  Administered 2022-05-11: 12.5 mg via ORAL
  Filled 2022-05-10: qty 1

## 2022-05-10 MED ORDER — SODIUM CHLORIDE 0.9 % IV SOLN: 250.0000 mL | INTRAVENOUS | Status: DC | PRN

## 2022-05-10 MED ORDER — LIDOCAINE HCL (PF) 1 % IJ SOLN
INTRAMUSCULAR | Status: DC | PRN
  Administered 2022-05-10: 15 mL
  Administered 2022-05-10: 2 mL

## 2022-05-10 MED ORDER — HYDRALAZINE HCL 20 MG/ML IJ SOLN: 10.0000 mg | INTRAMUSCULAR | Status: AC | PRN

## 2022-05-10 MED ORDER — HEPARIN BOLUS VIA INFUSION
4000.0000 [IU] | Freq: Once | INTRAVENOUS | Status: AC
  Administered 2022-05-10: 4000 [IU] via INTRAVENOUS
  Filled 2022-05-10: qty 4000

## 2022-05-10 MED ORDER — HEPARIN SODIUM (PORCINE) 1000 UNIT/ML IJ SOLN
INTRAMUSCULAR | Status: DC | PRN
  Administered 2022-05-10: 3000 [IU] via INTRAVENOUS
  Administered 2022-05-10: 9000 [IU] via INTRAVENOUS

## 2022-05-10 MED ORDER — APIXABAN 5 MG PO TABS
5.0000 mg | ORAL_TABLET | Freq: Two times a day (BID) | ORAL | Status: DC
  Administered 2022-05-11: 5 mg via ORAL
  Filled 2022-05-10: qty 1

## 2022-05-10 MED ORDER — SODIUM CHLORIDE 0.9% FLUSH: 3.0000 mL | INTRAVENOUS | Status: DC | PRN

## 2022-05-10 MED ORDER — SODIUM CHLORIDE 0.9 % IV SOLN
INTRAVENOUS | Status: AC
  Administered 2022-05-10: 100 mL/h via INTRAVENOUS

## 2022-05-10 MED ORDER — ASPIRIN 81 MG PO TBEC: 81.0000 mg | DELAYED_RELEASE_TABLET | Freq: Every day | ORAL | Status: DC

## 2022-05-10 MED ORDER — ASPIRIN 81 MG PO TBEC
81.0000 mg | DELAYED_RELEASE_TABLET | Freq: Every day | ORAL | Status: DC
  Administered 2022-05-10: 81 mg via ORAL
  Filled 2022-05-10: qty 1

## 2022-05-10 MED ORDER — LABETALOL HCL 5 MG/ML IV SOLN: 10.0000 mg | INTRAVENOUS | Status: AC | PRN

## 2022-05-10 MED ORDER — CARVEDILOL 3.125 MG PO TABS
3.1250 mg | ORAL_TABLET | Freq: Two times a day (BID) | ORAL | Status: DC
  Administered 2022-05-10: 3.125 mg via ORAL
  Filled 2022-05-10: qty 1

## 2022-05-10 MED ORDER — FENTANYL CITRATE (PF) 100 MCG/2ML IJ SOLN
INTRAMUSCULAR | Status: AC
  Filled 2022-05-10: qty 2

## 2022-05-10 MED ORDER — ACETAMINOPHEN 325 MG PO TABS
650.0000 mg | ORAL_TABLET | ORAL | Status: DC | PRN
  Administered 2022-05-10: 650 mg via ORAL

## 2022-05-10 MED ORDER — VERAPAMIL HCL 2.5 MG/ML IV SOLN
INTRAVENOUS | Status: AC
  Filled 2022-05-10: qty 2

## 2022-05-10 MED ORDER — HEPARIN (PORCINE) 25000 UT/250ML-% IV SOLN
1350.0000 [IU]/h | INTRAVENOUS | Status: DC
  Administered 2022-05-10: 1050 [IU]/h via INTRAVENOUS
  Filled 2022-05-10: qty 250

## 2022-05-10 MED ORDER — ACETAMINOPHEN 325 MG PO TABS
650.0000 mg | ORAL_TABLET | ORAL | Status: DC | PRN
  Administered 2022-05-11: 650 mg via ORAL
  Filled 2022-05-10: qty 2

## 2022-05-10 MED ORDER — IOHEXOL 350 MG/ML SOLN
INTRAVENOUS | Status: DC | PRN
  Administered 2022-05-10: 140 mL

## 2022-05-10 MED ORDER — NITROGLYCERIN 1 MG/10 ML FOR IR/CATH LAB
INTRA_ARTERIAL | Status: AC
  Filled 2022-05-10: qty 10

## 2022-05-10 MED ORDER — ATORVASTATIN CALCIUM 80 MG PO TABS
80.0000 mg | ORAL_TABLET | Freq: Every day | ORAL | Status: DC
  Administered 2022-05-10 – 2022-05-11 (×2): 80 mg via ORAL
  Filled 2022-05-10: qty 2
  Filled 2022-05-10: qty 1

## 2022-05-10 SURGICAL SUPPLY — 30 items
BALL SAPPHIRE NC24 2.50X15 (BALLOONS) ×1
BALLN SAPPHIRE 2.5X15 (BALLOONS) ×1
BALLOON SAPPHIRE 2.5X15 (BALLOONS) IMPLANT
BALLOON SAPPHIRE NC24 2.50X15 (BALLOONS) IMPLANT
CATH 5FR JL3.5 JR4 ANG PIG MP (CATHETERS) IMPLANT
CATH EXTRAC PRONTO LP 6F RND (CATHETERS) IMPLANT
CATH INFINITI 5FR JL4 (CATHETERS) IMPLANT
CATH LAUNCHER 5F EBU3.5 (CATHETERS) IMPLANT
CATH VISTA GUIDE 6FR XB4 (CATHETERS) IMPLANT
DEVICE RAD COMP TR BAND LRG (VASCULAR PRODUCTS) IMPLANT
GLIDESHEATH SLEND SS 6F .021 (SHEATH) IMPLANT
GUIDEWIRE INQWIRE 1.5J.035X260 (WIRE) IMPLANT
INQWIRE 1.5J .035X260CM (WIRE) ×1
KIT ENCORE 26 ADVANTAGE (KITS) IMPLANT
KIT HEART LEFT (KITS) ×1 IMPLANT
KIT MICROPUNCTURE NIT STIFF (SHEATH) IMPLANT
PACK CARDIAC CATHETERIZATION (CUSTOM PROCEDURE TRAY) ×1 IMPLANT
SHEATH PINNACLE 5F 10CM (SHEATH) IMPLANT
SHEATH PINNACLE 6F 10CM (SHEATH) IMPLANT
SHEATH PROBE COVER 6X72 (BAG) IMPLANT
STENT ONYX FRONTIER 2.5X12 (Permanent Stent) IMPLANT
STENT SYNERGY XD 2.50X16 (Permanent Stent) IMPLANT
STENT SYNERGY XD 2.50X24 (Permanent Stent) IMPLANT
SYNERGY XD 2.50X16 (Permanent Stent) ×1 IMPLANT
SYNERGY XD 2.50X24 (Permanent Stent) ×1 IMPLANT
TRANSDUCER W/STOPCOCK (MISCELLANEOUS) ×1 IMPLANT
TUBING CIL FLEX 10 FLL-RA (TUBING) ×1 IMPLANT
VALVE GUARDIAN II ~~LOC~~ HEMO (MISCELLANEOUS) IMPLANT
WIRE EMERALD 3MM-J .035X150CM (WIRE) IMPLANT
WIRE RUNTHROUGH .014X180CM (WIRE) IMPLANT

## 2022-05-10 NOTE — ED Notes (Signed)
ED Provider at bedside. 

## 2022-05-10 NOTE — Interval H&P Note (Signed)
Cath Lab Visit (complete for each Cath Lab visit)  Clinical Evaluation Leading to the Procedure:   ACS: Yes.    Non-ACS:    Anginal Classification: CCS IV  Anti-ischemic medical therapy: Minimal Therapy (1 class of medications)  Non-Invasive Test Results: No non-invasive testing performed  Prior CABG: No previous CABG      History and Physical Interval Note:  05/10/2022 1:18 PM  Ricky Peck  has presented today for surgery, with the diagnosis of nstemi.  The various methods of treatment have been discussed with the patient and family. After consideration of risks, benefits and other options for treatment, the patient has consented to  Procedure(s): LEFT HEART CATH AND CORONARY ANGIOGRAPHY (N/A) as a surgical intervention.  The patient's history has been reviewed, patient examined, no change in status, stable for surgery.  I have reviewed the patient's chart and labs.  Questions were answered to the patient's satisfaction.     Larae Grooms

## 2022-05-10 NOTE — ED Provider Notes (Signed)
EMERGENCY DEPARTMENT AT [facility] Provider Note   CSN: [id number] Arrival date & time: 05/09/22  2111     History  Chief Complaint  Patient presents with   Chest Pain    Ricky Peck is a 62 y.o. male.  HPI   Patient with medical history including hypertension, hyperlipidemia, sleep apnea, presented with complaints of chest pain, chest pain started yesterday night while he was asleep, woke him up, pain remains the middle of his chest does not radiate, remains constant, states he feels more fatigued, but denies any shortness of breath or pleuritic chest pain, denies any worsening peripheral edema, cardiac abnormalities, admits to occasional tobacco use, no family history of cardiac abnormalities.  No history PEs or DVTs not on hormone therapy no recent surgeries long immobilizations.    Home Medications Prior to Admission medications   Medication Sig Start Date End Date Taking? Authorizing Provider  amLODipine (NORVASC) 10 MG tablet Take 10 mg by mouth daily.   Yes [provider]  apixaban (ELIQUIS) 5 MG TABS tablet Take 5 mg by mouth 2 (two) times daily.   Yes [provider]  carvedilol (COREG) 25 MG tablet Take 12.5 mg by mouth 2 (two) times daily with a meal.   Yes [provider]  losartan (COZAAR) 100 MG tablet Take 100 mg by mouth daily.   Yes [provider]  benzonatate (TESSALON) 100 MG capsule Take 1 capsule (100 mg total) by mouth 3 (three) times daily as needed for cough. Do not take with alcohol or while driving or operating heavy machinery.  May cause drowsiness. Patient not taking: Reported on 05/10/2022 04/09/22   Eulogio Bear, NP      Allergies    Lisinopril    Review of Systems   Review of Systems  Constitutional:  Negative for chills and fever.  Respiratory:  Negative for shortness of breath.   Cardiovascular:  Positive for chest pain.  Gastrointestinal:  Negative for abdominal pain.   Neurological:  Negative for headaches.    Physical Exam Updated Vital Signs BP 138/88   Pulse 72   Temp 97.6 F (36.4 C) (Oral)   Resp 15   Ht '5\' 8"'$  (1.727 m)   Wt 86.2 kg   SpO2 99%   BMI 28.89 kg/m  Physical Exam Vitals and nursing note reviewed.  Constitutional:      General: He is not in acute distress.    Appearance: He is not ill-appearing.  HENT:     Head: Normocephalic and atraumatic.     Nose: No congestion.  Eyes:     Conjunctiva/sclera: Conjunctivae normal.  Cardiovascular:     Rate and Rhythm: Normal rate and regular rhythm.     Pulses: Normal pulses.     Heart sounds: No murmur heard.    No friction rub. No gallop.  Pulmonary:     Effort: No respiratory distress.     Breath sounds: No wheezing, rhonchi or rales.  Musculoskeletal:     Right lower leg: No edema.     Left lower leg: No edema.  Skin:    General: Skin is warm and dry.  Neurological:     Mental Status: He is alert.     Comments: No facial asymmetry no difficulty with word finding following two-step commands there is no unilateral weakness present.  Psychiatric:        Mood and Affect: Mood normal.     ED Results / Procedures /  Treatments   Labs (all labs ordered are listed, but only abnormal results are displayed) Labs Reviewed  BASIC METABOLIC PANEL - Abnormal; Notable for the following components:      Result Value   Sodium 133 (*)    Chloride 95 (*)    Glucose, Bld 134 (*)    All other components within normal limits  CBC - Abnormal; Notable for the following components:   RBC 5.96 (*)    Hemoglobin 17.2 (*)    HCT 52.7 (*)    All other components within normal limits  BASIC METABOLIC PANEL - Abnormal; Notable for the following components:   Sodium 134 (*)    Potassium 3.4 (*)    Chloride 97 (*)    Glucose, Bld 184 (*)    All other components within normal limits  LIPID PANEL - Abnormal; Notable for the following components:   Cholesterol 250 (*)    HDL 39 (*)    LDL  Cholesterol 195 (*)    All other components within normal limits  TROPONIN I (HIGH SENSITIVITY) - Abnormal; Notable for the following components:   Troponin I (High Sensitivity) 4,434 (*)    All other components within normal limits  TROPONIN I (HIGH SENSITIVITY) - Abnormal; Notable for the following components:   Troponin I (High Sensitivity) 5,513 (*)    All other components within normal limits  CBC  TSH  HIV ANTIBODY (ROUTINE TESTING W REFLEX)  LIPOPROTEIN A (LPA)  HEMOGLOBIN A1C  HEPARIN LEVEL (UNFRACTIONATED)  APTT    EKG EKG Interpretation  Date/Time:  Thursday May 10 2022 00:56:01 EDT Ventricular Rate:  77 PR Interval:  154 QRS Duration: 156 QT Interval:  454 QTC Calculation: 514 R Axis:   0 Text Interpretation: Sinus rhythm Atrial premature complexes Right bundle branch block Rate is slower Confirmed by Molpus, John [phone]) on 05/10/2022 1:08:11 AM  Radiology CT Angio Chest/Abd/Pel for Dissection W and/or Wo Contrast  Result Date: 05/09/2022 CLINICAL DATA:  Acute aortic syndrome suspected EXAM: CT ANGIOGRAPHY CHEST, ABDOMEN AND PELVIS TECHNIQUE: Non-contrast CT of the chest was initially obtained. Multidetector CT imaging through the chest, abdomen and pelvis was performed using the standard protocol during bolus administration of intravenous contrast. Multiplanar reconstructed images and MIPs were obtained and reviewed to evaluate the vascular anatomy. RADIATION DOSE REDUCTION: This exam was performed according to the departmental dose-optimization program which includes automated exposure control, adjustment of the mA and/or kV according to patient size and/or use of iterative reconstruction technique. CONTRAST:  131m OMNIPAQUE IOHEXOL 350 MG/ML SOLN COMPARISON:  None Available. FINDINGS: CTA CHEST FINDINGS Cardiovascular: Preferential opacification of the thoracic aorta. No evidence of thoracic aortic aneurysm or dissection. Normal heart size. Coronary artery  atherosclerotic calcifications atherosclerotic calcification of the aortic arch. No pericardial effusion. Mediastinum/Nodes: No enlarged mediastinal, hilar, or axillary lymph nodes. Thyroid gland, trachea, and esophagus demonstrate no significant findings. Lungs/Pleura: Bibasilar dependent atelectasis. No acute pulmonary process. No pleural effusion or pneumothorax. Musculoskeletal: No chest wall abnormality. No acute or significant osseous findings. Review of the MIP images confirms the above findings. CTA ABDOMEN AND PELVIS FINDINGS VASCULAR Aorta: Normal caliber aorta without aneurysm, dissection, vasculitis or significant stenosis. Celiac: Patent without evidence of aneurysm, dissection, vasculitis or significant stenosis. SMA: Patent without evidence of aneurysm, dissection, vasculitis or significant stenosis. Renals: Both renal arteries are patent without evidence of aneurysm, dissection, vasculitis, fibromuscular dysplasia or significant stenosis. IMA: Patent without evidence of aneurysm, dissection, vasculitis or significant stenosis. Inflow: Patent without evidence of  aneurysm, dissection, vasculitis or significant stenosis. Veins: No obvious venous abnormality within the limitations of this arterial phase study. Review of the MIP images confirms the above findings. NON-VASCULAR Hepatobiliary: No focal liver abnormality is seen. No gallstones, gallbladder wall thickening, or biliary dilatation. Pancreas: Unremarkable. No pancreatic ductal dilatation or surrounding inflammatory changes. Spleen: Normal in size without focal abnormality. Adrenals/Urinary Tract: Adrenal glands are unremarkable. Kidneys are normal, without renal calculi, focal lesion, or hydronephrosis. Bladder is unremarkable. Stomach/Bowel: Stomach is within normal limits. Appendix appears normal. No evidence of bowel wall thickening, distention, or inflammatory changes. Lymphatic: No lymphadenopathy Reproductive: Prostate is mildly enlarged  measuring 4.9 cm in transverse dimension Other: No abdominal wall hernia or abnormality. No abdominopelvic ascites. Musculoskeletal: No acute or significant osseous findings. 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 No osseous lesion Review of the MIP images confirms the above findings. IMPRESSION: 1. No evidence of thoracic or abdominal aortic aneurysm or dissection. 2. Coronary artery and mild aortic atherosclerotic calcifications. 3. No acute intrathoracic, abdominal or pelvic process. 4. 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 5. Mild prostatomegaly. Electronically Signed   By: Keane Police D.O.   On: 05/09/2022 23:33   DG Chest 2 View  Result Date: 05/09/2022 CLINICAL DATA:  Cp.  Mid chest pain, vomiting EXAM: CHEST - 2 VIEW COMPARISON:  Chest x-ray 09/18/2013 FINDINGS: The heart and mediastinal contours are unchanged. Aortic calcification. No focal consolidation. No pulmonary edema. No pleural effusion. No pneumothorax. No acute osseous abnormality. IMPRESSION: 1. No active cardiopulmonary disease. 2.  Aortic Atherosclerosis (ICD10-I70.0). Electronically Signed   By: Iven Finn M.D.   On: 05/09/2022 21:51    Procedures .Critical Care  Performed by: Marcello Fennel, PA-C Authorized by: Marcello Fennel, PA-C   Critical care provider statement:    Critical care time (minutes):  50   Critical care time was exclusive of:  Separately billable procedures and treating other patients   Critical care was necessary to treat or prevent imminent or life-threatening deterioration of the following conditions:  Cardiac failure   Critical care was time spent personally by me on the following activities:  Development of treatment plan with patient or surrogate, discussions with consultants, evaluation of patient's response to treatment, examination of patient, ordering and review of  laboratory studies, ordering and review of radiographic studies, ordering and performing treatments and interventions, pulse oximetry, re-evaluation of patient's condition and review of old charts   I assumed direction of critical care for this patient from another provider in my specialty: no     Care discussed with: admitting provider       Medications Ordered in ED Medications  nitroGLYCERIN 50 mg in dextrose 5 % 250 mL (0.2 mg/mL) infusion (55 mcg/min Intravenous Rate/Dose Change 05/10/22 0408)  heparin ADULT infusion 100 units/mL (25000 units/240m) (1,050 Units/hr Intravenous New Bag/Given 05/10/22 0051)  aspirin EC tablet 81 mg (has no administration in time range)  acetaminophen (TYLENOL) tablet 650 mg (has no administration in time range)  ondansetron (ZOFRAN) injection 4 mg (has no administration in time range)  atorvastatin (LIPITOR) tablet 80 mg (has no administration in time range)  carvedilol (COREG) tablet 3.125 mg (has no administration in time range)  losartan (COZAAR) tablet 50 mg (has no administration in time range)  morphine (PF) 4 MG/ML injection 4 mg (4 mg Intravenous Given 05/09/22 2258)  aspirin chewable tablet 324 mg (324 mg Oral Given  05/09/22 2259)  iohexol (OMNIPAQUE) 350 MG/ML injection 100 mL (100 mLs Intravenous Contrast Given 05/09/22 2310)  heparin bolus via infusion 4,000 Units (4,000 Units Intravenous Bolus from Bag 05/10/22 0052)    ED Course/ Medical Decision Making/ A&P                             Medical Decision Making Amount and/or Complexity of Data Reviewed Radiology: ordered.  Risk OTC drugs. Prescription drug management. Decision regarding hospitalization.   This patient presents to the ED for concern of chest pain, this involves an extensive number of treatment options, and is a complaint that carries with it a high risk of complications and morbidity.  The differential diagnosis includes ACS, PE, dissection    Additional history  obtained:  Additional history obtained from wife at bedside External records from outside source obtained and reviewed including urgent care notes   Co morbidities that complicate the patient evaluation  Hypertension  Social Determinants of Health:  N/a    Lab Tests:  I Ordered, and personally interpreted labs.  The pertinent results include: CBC unremarkable, BMP reveals sodium 133, chloride 95, glucose 134, first troponin is 4434   Imaging Studies ordered:  I ordered imaging studies including chest x-ray, dissection study I independently visualized and interpreted imaging which showed both negative for acute findings I agree with the radiologist interpretation   Cardiac Monitoring:  The patient was maintained on a cardiac monitor.  I personally viewed and interpreted the cardiac monitored which showed an underlying rhythm of: Sinus tach without signs of ischemia   Medicines ordered and prescription drug management:  I ordered medication including morphine I have reviewed the patients home medicines and have made adjustments as needed  Critical Interventions:  Patient has active chest pain elevated BP and elevated troponin, will start on IV nitroglycerin Reassessed the patient states she is feeling much better, BP remains elevated, will increase IV nitroglycerin Dissection study is negative, will initiate IV heparin, patient denies any history of GI bleeds, intracranial bleeds, no recent surgeries. Patient was reassessed resting comfortable, states he is feeling much better no more chest pain at this time.   Reevaluation:  Presents with chest pain, triage placed initial lab work, troponin was elevated greater than 4000, concern for possible dissection with the elevated BP, will withhold heparin at this time, sent down for emergent CT angio rule out dissection, start him on IV nitroglycerin for blood pressure control and chest pain, provide with aspirin and morphine.   Consult cardiology  Updated patient on current plan of transferring to [facility] for further evaluation by cardiology, he is going this plan.   Consultations Obtained:  I requested consultation with Dr Foye Clock,  and discussed lab and imaging findings as well as pertinent plan - they recommend: Agrees with the delay of heparin until CT imaging is back, manage blood pressure, pain, if CT imaging negative for dissection or PE, patient remained stable, pain has improved, no EKG changes patient remain at [facility] long, I think above due to change patient will have to be transferred to [facility]. Repage Dr Foye Clock cardiology, obtained on lab work and imaging, cardiology will admit the patient, and he will be transferred to [facility].    Test Considered:  N/a    Rule out  Low suspicion for dissection, PE, is low at this time as CT imaging negative these findings, presentation seems consistent with NSTEMI.  Suspicion for  cardiogenic shock is low as BP has remained stable, no evidence of pleural effusion, lung sounds are clear bilaterally.  Doubt CVA or intracranial head bleed endorsing headache change in vision paresthesias or weakness of lower extremities no focal deficit on my exam.   Dispostion and problem list  After consideration of the diagnostic results and the patients response to treatment, I feel that the patent would benefit from admission  NSTEMI-currently on IV nitro, heparin, will be transferred to [facility] for further evaluation by cardiology, suspect patient will need catheterization.            Final Clinical Impression(s) / ED Diagnoses Final diagnoses:  NSTEMI (non-ST elevated myocardial infarction) [facility])    Rx / [place] Orders ED Discharge Orders     None         Marcello Fennel, PA-C 05/10/22 0534    Shanon Rosser, MD 05/10/22 [phone]

## 2022-05-10 NOTE — H&P (Addendum)
Cardiology Admission History and Physical   Patient ID: DRAELYN SERVICE MRN: [id number]; DOB: [birth year]   Admission date: 05/09/2022  PCP:  Administration, [place] [facility] Cardiologist:  Berniece Salines, DO   -- new    Chief Complaint: Chest pain  Patient Profile:   Ricky Peck is a 62 y.o. 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  History of Present Illness:   Mr. Ricky Peck is a 62 year old male with above medical history.  Patient reports that he has been admitted to the hospital due to hypertension in the past, denies ever seeing a cardiologist.  Denies history of chest pain, heart attack, heart failure.  Denies family history of CAD.  He does have a known history of hypertension and is compliant with his home medications.  Infrequently will smoke cigars.  Denies drug use.  Does not take medication for his cholesterol.  No history of diabetes  Patient reports that very early yesterday morning (around 5-6 AM), he started to feel "terrible".  Reports feeling very fatigued and worn out.  Also developed substernal chest pain that was slightly to the right.  Reports that pain was stabbing in nature.  Pain did not radiate.  Also reported feeling very nauseous and having some trouble breathing.  Denies palpitations, syncope, near syncope, dizziness.  His symptoms were constant throughout the day yesterday.  They did seem to worsen throughout the day.  He came to the emergency department after he spoke with his wife and pastor who were very concerned about his symptoms.  Patient presented to the [facility] long ED yesterday evening.  At that time, reported that his pain was about an 8/10.  High-sensitivity troponin 4434> 5513.  Potassium 4.0, creatinine 1.11, WBC 7.2, hemoglobin 17.2, platelets 211.  CTA of chest showed no  evidence of thoracic or abdominal aortic aneurysm or dissection, coronary artery and mild aortic atherosclerotic calcifications. EKG upon arrival showed sinus tachycardia with heart rate 106 bpm, right bundle branch block present.  Past Medical History:  Diagnosis Date   Allergy    Arthritis    Hyperlipidemia    Hypertension    Sleep apnea    cpap    Past Surgical History:  Procedure Laterality Date   quadricep surgery  2014     Medications Prior to Admission: Prior to Admission medications   Medication Sig Start Date End Date Taking? Authorizing Provider  amLODipine (NORVASC) 10 MG tablet Take 10 mg by mouth daily.   Yes [provider]  apixaban (ELIQUIS) 5 MG TABS tablet Take 5 mg by mouth 2 (two) times daily.   Yes [provider]  carvedilol (COREG) 25 MG tablet Take 12.5 mg by mouth 2 (two) times daily with a meal.   Yes [provider]  losartan (COZAAR) 100 MG tablet Take 100 mg by mouth daily.   Yes [provider]  benzonatate (TESSALON) 100 MG capsule Take 1 capsule (100 mg total) by mouth 3 (three) times daily as needed for cough. Do not take with alcohol or while driving or operating heavy machinery.  May cause drowsiness. Patient not taking: Reported on 05/10/2022 04/09/22   Eulogio Bear, NP     Allergies:    Allergies  Allergen Reactions   Lisinopril Other (See Comments)    Social History:   Social History  Socioeconomic History   Marital status: Married    Spouse name: Not on file   Number of children: Not on file   Years of education: Not on file   Highest education level: Not on file  Occupational History   Not on file  Tobacco Use   Smoking status: Former    Types: Cigarettes    Quit date: 02/25/2017    Years since quitting: 5.2   Smokeless tobacco: Never  Vaping Use   Vaping Use: Never used  Substance and Sexual Activity   Alcohol use: Yes    Comment: occasionally   Drug use: No   Sexual activity:  Yes    Birth control/protection: None  Other Topics Concern   Not on file  Social History Narrative   Not on file   Social Determinants of Health   Financial Resource Strain: Not on file  Food Insecurity: Not on file  Transportation Needs: Not on file  Physical Activity: Not on file  Stress: Not on file  Social Connections: Not on file  Intimate Partner Violence: Not on file    Family History:   The patient's family history is negative for Colon cancer, Colon polyps, Esophageal cancer, Rectal cancer, and Stomach cancer.    ROS:  Please see the history of present illness.  All other ROS reviewed and negative.     Physical Exam/Data:   Vitals:   05/10/22 1050 05/10/22 1134 05/10/22 1140 05/10/22 1219  BP: 115/83 (!) 146/110 139/89 (!) 149/111  Pulse: 85 91 85 83  Resp:  '16 16 16  '$ Temp:    98.6 F (37 C)  TempSrc:      SpO2: 96% 95% 97% 97%  Weight:      Height:       No intake or output data in the 24 hours ending 05/10/22 1233    05/09/2022    9:24 PM 12/09/2019    7:49 AM 04/08/2017    8:29 AM  Last 3 Weights  Weight (lbs) 190 lb 195 lb 204 lb  Weight (kg) 86.183 kg 88.451 kg 92.534 kg     Body mass index is 28.89 kg/m.  General:  Well nourished, well developed, in no acute distress. Laying flat in the bed  HEENT: normal Neck: no JVD Vascular: Radial pulses 2+ bilaterally   Cardiac:  normal S1, S2; RRR; no murmur  Lungs:  crackles in left lung base, otherwise clear to auscultation. Normal WOB on room air  Abd: soft, nontender Ext: no edema in BLE  Musculoskeletal:  No deformities, BUE and BLE strength normal and equal Skin: warm and dry  Neuro:  CNs 2-12 intact, no focal abnormalities noted Psych:  Normal affect    EKG:  The ECG that was done on 05/09/22 was personally reviewed and demonstrates sinus tachycardia heart rate 106 bpm, right bundle branch block  Relevant CV Studies:  Echocardiogram Pending   Laboratory Data:  High Sensitivity Troponin:    Recent Labs  Lab 05/09/22 2152 05/10/22 0015 05/10/22 0925  TROPONINIHS 4,434* 5,513* 23,690*      Chemistry Recent Labs  Lab 05/09/22 2152 05/10/22 0336  NA 133* 134*  K 4.0 3.4*  CL 95* 97*  CO2 23 22  GLUCOSE 134* 184*  BUN 10 10  CREATININE 1.11 0.93  CALCIUM 10.0 9.7  GFRNONAA >60 >60  ANIONGAP 15 15    No results for input(s): "PROT", "ALBUMIN", "AST", "ALT", "ALKPHOS", "BILITOT" in the last 168 hours. Lipids  Recent Labs  Lab 05/10/22 0334  CHOL 250*  TRIG 78  HDL 39*  LDLCALC 195*  CHOLHDL 6.4   Hematology Recent Labs  Lab 05/09/22 2152 05/10/22 0336  WBC 7.2 7.6  RBC 5.96* 5.20  HGB 17.2* 14.8  HCT 52.7* 46.3  MCV 88.4 89.0  MCH 28.9 28.5  MCHC 32.6 32.0  RDW 13.3 13.4  PLT 211 192   Thyroid  Recent Labs  Lab 05/10/22 0335  TSH 3.360   BNPNo results for input(s): "BNP", "PROBNP" in the last 168 hours.  DDimer No results for input(s): "DDIMER" in the last 168 hours.   Radiology/Studies:  CT Angio Chest/Abd/Pel for Dissection W and/or Wo Contrast  Result Date: 05/09/2022 CLINICAL DATA:  Acute aortic syndrome suspected EXAM: CT ANGIOGRAPHY CHEST, ABDOMEN AND PELVIS TECHNIQUE: Non-contrast CT of the chest was initially obtained. Multidetector CT imaging through the chest, abdomen and pelvis was performed using the standard protocol during bolus administration of intravenous contrast. Multiplanar reconstructed images and MIPs were obtained and reviewed to evaluate the vascular anatomy. RADIATION DOSE REDUCTION: This exam was performed according to the departmental dose-optimization program which includes automated exposure control, adjustment of the mA and/or kV according to patient size and/or use of iterative reconstruction technique. CONTRAST:  17m OMNIPAQUE IOHEXOL 350 MG/ML SOLN COMPARISON:  None Available. FINDINGS: CTA CHEST FINDINGS Cardiovascular: Preferential opacification of the thoracic aorta. No evidence of thoracic aortic aneurysm  or dissection. Normal heart size. Coronary artery atherosclerotic calcifications atherosclerotic calcification of the aortic arch. No pericardial effusion. Mediastinum/Nodes: No enlarged mediastinal, hilar, or axillary lymph nodes. Thyroid gland, trachea, and esophagus demonstrate no significant findings. Lungs/Pleura: Bibasilar dependent atelectasis. No acute pulmonary process. No pleural effusion or pneumothorax. Musculoskeletal: No chest wall abnormality. No acute or significant osseous findings. Review of the MIP images confirms the above findings. CTA ABDOMEN AND PELVIS FINDINGS VASCULAR Aorta: Normal caliber aorta without aneurysm, dissection, vasculitis or significant stenosis. Celiac: Patent without evidence of aneurysm, dissection, vasculitis or significant stenosis. SMA: Patent without evidence of aneurysm, dissection, vasculitis or significant stenosis. Renals: Both renal arteries are patent without evidence of aneurysm, dissection, vasculitis, fibromuscular dysplasia or significant stenosis. IMA: Patent without evidence of aneurysm, dissection, vasculitis or significant stenosis. Inflow: Patent without evidence of aneurysm, dissection, vasculitis or significant stenosis. Veins: No obvious venous abnormality within the limitations of this arterial phase study. Review of the MIP images confirms the above findings. NON-VASCULAR Hepatobiliary: No focal liver abnormality is seen. No gallstones, gallbladder wall thickening, or biliary dilatation. Pancreas: Unremarkable. No pancreatic ductal dilatation or surrounding inflammatory changes. Spleen: Normal in size without focal abnormality. Adrenals/Urinary Tract: Adrenal glands are unremarkable. Kidneys are normal, without renal calculi, focal lesion, or hydronephrosis. Bladder is unremarkable. Stomach/Bowel: Stomach is within normal limits. Appendix appears normal. No evidence of bowel wall thickening, distention, or inflammatory changes. Lymphatic: No  lymphadenopathy Reproductive: Prostate is mildly enlarged measuring 4.9 cm in transverse dimension Other: No abdominal wall hernia or abnormality. No abdominopelvic ascites. Musculoskeletal: No acute or significant osseous findings. 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 No osseous lesion Review of the MIP images confirms the above findings. IMPRESSION: 1. No evidence of thoracic or abdominal aortic aneurysm or dissection. 2. Coronary artery and mild aortic atherosclerotic calcifications. 3. No acute intrathoracic, abdominal or pelvic process. 4. 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 5. Mild prostatomegaly. Electronically Signed   By: IKeane PoliceD.O.   On: 05/09/2022 23:33  DG Chest 2 View  Result Date: 05/09/2022 CLINICAL DATA:  Cp.  Mid chest pain, vomiting EXAM: CHEST - 2 VIEW COMPARISON:  Chest x-ray 09/18/2013 FINDINGS: The heart and mediastinal contours are unchanged. Aortic calcification. No focal consolidation. No pulmonary edema. No pleural effusion. No pneumothorax. No acute osseous abnormality. IMPRESSION: 1. No active cardiopulmonary disease. 2.  Aortic Atherosclerosis (ICD10-I70.0). Electronically Signed   By: Iven Finn M.D.   On: 05/09/2022 21:51     Assessment and Plan:   NSTEMI -Patient presented yesterday evening complaining of chest pain, shortness of breath, fatigue, nausea.  Symptoms had been going on since that morning.  High-sensitivity troponin 4434> 5513. EKG with RBBB (old) - Patient was started on IV heparin and IV nitroglycerin in the ED.  Today, continues to feel very fatigued but denies having chest pain - Plan for cardiac catheterization.  Patient is on Eliquis, took his last dose 3/13 at 10 AM. -Case was discussed with Dr. Nicholos Johns for cardiac catheterization tomorrow given patient's recent Eliquis use - Continue IV heparin, IV  nitroglycerin -Continue carvedilol 3.125 mg twice daily.  Patient was on 25 mg twice daily at home, dose is currently reduced as patient is on IV nitroglycerin - Start Lipitor 80 mg daily - Continue daily aspirin Shared Decision Making/Informed Consent The risks [stroke (1 in 1000), death (1 in 1000), kidney failure [usually temporary] (1 in 500), bleeding (1 in 200), allergic reaction [possibly serious] (1 in 200)], benefits (diagnostic support and management of coronary artery disease) and alternatives of a cardiac catheterization were discussed in detail with Mr. Mais and he is willing to proceed.   Hyperlipidemia - Lipid panel this admission showed total cholesterol 250, HDL 39, LDL 195, triglycerides 78  - Started lipitor 80 mg daily  - Will need LFTs and lipid panel in 8 weeks - Lipoprotein a pending   HTN  - Patient is currently on IV nitro and BP is stable - Continue carvedilol 3.125 mg BID for now- plan to increase back to home dose of 25 mg BID when he is off nitro  - Continue losartan 50 mg daily    Risk Assessment/Risk Scores:   TIMI Risk Score for Unstable Angina or Non-ST Elevation MI:   The patient's TIMI risk score is 1, which indicates a 5% risk of all cause mortality, new or recurrent myocardial infarction or need for urgent revascularization in the next 14 days.  Severity of Illness: The appropriate patient status for this patient is INPATIENT. Inpatient status is judged to be reasonable and necessary in order to provide the required intensity of service to ensure the patient's safety. The patient's presenting symptoms, physical exam findings, and initial radiographic and laboratory data in the context of their chronic comorbidities is felt to place them at high risk for further clinical deterioration. Furthermore, it is not anticipated that the patient will be medically stable for discharge from the hospital within 2 midnights of admission.   * I certify that at the  point of admission it is my clinical judgment that the patient will require inpatient hospital care spanning beyond 2 midnights from the point of admission due to high intensity of service, high risk for further deterioration and high frequency of surveillance required.*   For questions or updates, please contact [place] Please consult [email or web address] for contact info under    Signed, Berniece Salines, DO  05/10/2022 12:33 PM   Patient seen and examined, note reviewed with the signed Advanced Practice Provider.  I personally reviewed laboratory data, imaging studies and relevant notes. I independently examined the patient and formulated the important aspects of the plan. I have personally discussed the plan with the patient and/or family. Comments or changes to the note/plan are indicated below.  NSTEMI Hyperlipidemia Hypertension   Currently troponins trending up most recent 23,690.  He is currently on therapy for NSTEMI with the heparin drip, continue with IV nitroglycerin, low-dose beta-blocker, aspirin and statin.  With the increase in troponin plan is for heart catheterization today.  Spoke with the patient and his wife about this.  He is agreeable.  We are going to transfer the patient to [facility].  Shared Decision Making/Informed Consent The risks [stroke (1 in 1000), death (1 in 29), kidney failure [usually temporary] (1 in 500), bleeding (1 in 200), allergic reaction [possibly serious] (1 in 200)], benefits (diagnostic support and management of coronary artery disease) and alternatives of a cardiac catheterization were discussed in detail with Mr. Kintzel and he is willing to proceed.   Hemoglobin A1c, LP(a) all pending. Lipid profile showed total cholesterol 250, triglycerides 78, HDL 39, LDL 195.  For now continue atorvastatin 80 mg daily.   Berniece Salines DO, MS [facility] Attending Cardiologist [place]  [address] [place], [place]  [address] [phone] Website: [email or web address]

## 2022-05-10 NOTE — Progress Notes (Signed)
TR BAND REMOVAL  LOCATION:    Right radial   DEFLATED PER PROTOCOL:    Yes.    TIME BAND OFF / DRESSING APPLIED:    1900pm A clean dry dressing applied with gauze and tegaderm   SITE UPON ARRIVAL:    Level 0  SITE AFTER BAND REMOVAL:    Level 0  CIRCULATION SENSATION AND MOVEMENT:    Within Normal Limits   Yes.    COMMENTS:   A clean dry dressing applied

## 2022-05-10 NOTE — ED Notes (Signed)
[facility] called and will be here in 45 minutes.

## 2022-05-10 NOTE — Progress Notes (Signed)
Site area: Right groin a 6 french arterial sheath was removed  Site Prior to Removal:  Level 0  Pressure Applied For 20 MINUTES    Bedrest Beginning at 2000pm X 4 hours  Manual:   Yes.    Patient Status During Pull:  stable  Post Pull Groin Site:  Level 0  Post Pull Instructions Given:  Yes.    Post Pull Pulses Present:  Yes.    Dressing Applied:  Yes.    Comments:

## 2022-05-10 NOTE — ED Notes (Signed)
MD Berniece Salines at bedside at this time, advised of patient last critical Troponin level

## 2022-05-10 NOTE — Progress Notes (Signed)
ANTICOAGULATION CONSULT NOTE  Pharmacy Consult for Heparin Indication: chest pain/ACS  Allergies  Allergen Reactions   Lisinopril Other (See Comments)    Patient Measurements: Height: '5\' 8"'$  (172.7 cm) Weight: 86.2 kg (190 lb) IBW/kg (Calculated) : 68.4 Heparin Dosing Weight: TBW  Vital Signs: Temp: 97.8 F (36.6 C) (03/14 0832) Temp Source: Oral (03/14 0531) BP: 174/101 (03/14 0832) Pulse Rate: 87 (03/14 0832)  Labs: Recent Labs    05/09/22 2152 05/10/22 0015 05/10/22 0336 05/10/22 0810  HGB 17.2*  --  14.8  --   HCT 52.7*  --  46.3  --   PLT 211  --  192  --   APTT  --   --   --  44*  HEPARINUNFRC  --   --   --  0.43  CREATININE 1.11  --  0.93  --   TROPONINIHS 4,434* 5,513*  --   --      Estimated Creatinine Clearance: 89.1 mL/min (by C-G formula based on SCr of 0.93 mg/dL).   Medications:  Infusions:   sodium chloride     heparin 1,050 Units/hr (05/10/22 0051)   nitroGLYCERIN 55 mcg/min (05/10/22 0408)   Assessment: 62 yo M presenting with chest pain.  Troponin elevated.  Pharmacy consulted to dose IV heparin. Not on anticoagulation PTA. Baseline CBC- Hg elevated at 17.2, pltc WNL. Scr 1.11  Baseline aPTT: not done Prior anticoagulation: Eliquis 5 mg PO bid; LD 3/13 AM  Significant events:  Today, 05/10/2022: CBC: WNL 1st aPTT subtherapeutic on heparin at 1050 units/hr. Heparin level in therapeutic range, but isolated level is not clinically reliable given recent Eliquis SCr stable; at baseline No bleeding or infusion issues per nursing  Goal of Therapy: Heparin level 0.3-0.7 units/ml Monitor platelets by anticoagulation protocol: Yes  Plan: Repeat heparin 4000 units IV bolus x 1 Increase heparin IV infusion to 1350 units/hr Check heparin level 6 hrs after rate change Daily CBC, daily heparin level once stable Monitor for signs of bleeding or thrombosis  Reuel Boom, PharmD, BCPS [phone] 05/10/2022, 10:24 AM

## 2022-05-10 NOTE — Progress Notes (Addendum)
ANTICOAGULATION CONSULT NOTE - Initial Consult  Pharmacy Consult for Heparin Indication: chest pain/ACS  Allergies  Allergen Reactions   Lisinopril Other (See Comments)    Patient Measurements: Height: '5\' 8"'$  (172.7 cm) Weight: 86.2 kg (190 lb) IBW/kg (Calculated) : 68.4 Heparin Dosing Weight: TBW  Vital Signs: Temp: 98.4 F (36.9 C) (03/13 2146) Temp Source: Oral (03/13 2146) BP: 185/119 (03/13 2200) Pulse Rate: 79 (03/13 2200)  Labs: Recent Labs    05/09/22 2152  HGB 17.2*  HCT 52.7*  PLT 211  CREATININE 1.11  TROPONINIHS 4,434*    Estimated Creatinine Clearance: 74.6 mL/min (by C-G formula based on SCr of 1.11 mg/dL).   Medical History: Past Medical History:  Diagnosis Date   Allergy    Arthritis    Hyperlipidemia    Hypertension    Sleep apnea    cpap    Medications:  Infusions:   sodium chloride     nitroGLYCERIN Stopped (05/10/22 0002)    Assessment: 62 yo M presenting with chest pain.  Troponin elevated.  Pharmacy consulted to dose IV heparin. Not on anticoagulation PTA. Baseline CBC- Hg elevated at 17.2, pltc WNL. Scr 1.11  Goal of Therapy:  Heparin level 0.3-0.7 units/ml Monitor platelets by anticoagulation protocol: Yes   Plan:  Heparin 4000 units IV bolus x1 Heparin infusion at 1050 units/hr Check heparin level in 6h Daily heparin level & CBC while on heparin  Netta Cedars PharmD 05/10/2022,12:19 AM  Update:  Med history was completed and noted patient is on Eliquis '5mg'$  po BID PTA for hx of blood clot.  Last dose was 3/13 AM.   Heparin bolus has already been administered.   Will check aPTT in addition to heparin level at 0700.  Dr Foye Clock made aware patient is on Eliquis.   Netta Cedars, PharmD, BCPS 05/10/2022'@2'$ :25 AM

## 2022-05-10 NOTE — Progress Notes (Signed)
Paged Dr. Marcelle Smiling ( Carddiology on call) for confirmation of restarting the Heparin IV before starting po Eliquis tom.Dr. Marcelle Smiling ordered to restart Heparin IV around 3AM 05/11/2022 and titrate down and wean off IV Nitroglycerin IV. Will continue to monitor pt.

## 2022-05-11 ENCOUNTER — Other Ambulatory Visit (HOSPITAL_COMMUNITY): Payer: Self-pay

## 2022-05-11 ENCOUNTER — Encounter (HOSPITAL_COMMUNITY): Payer: Self-pay | Admitting: Interventional Cardiology

## 2022-05-11 ENCOUNTER — Inpatient Hospital Stay (HOSPITAL_COMMUNITY): Payer: No Typology Code available for payment source

## 2022-05-11 DIAGNOSIS — E876 Hypokalemia: Secondary | ICD-10-CM | POA: Insufficient documentation

## 2022-05-11 DIAGNOSIS — I10 Essential (primary) hypertension: Secondary | ICD-10-CM | POA: Insufficient documentation

## 2022-05-11 DIAGNOSIS — I214 Non-ST elevation (NSTEMI) myocardial infarction: Secondary | ICD-10-CM

## 2022-05-11 DIAGNOSIS — E785 Hyperlipidemia, unspecified: Secondary | ICD-10-CM | POA: Insufficient documentation

## 2022-05-11 DIAGNOSIS — Z86718 Personal history of other venous thrombosis and embolism: Secondary | ICD-10-CM

## 2022-05-11 DIAGNOSIS — I2510 Atherosclerotic heart disease of native coronary artery without angina pectoris: Secondary | ICD-10-CM | POA: Insufficient documentation

## 2022-05-11 LAB — ECHOCARDIOGRAM COMPLETE
Area-P 1/2: 3.99 cm2
Height: 67 in
S' Lateral: 2.8 cm
Weight: 3167.57 oz

## 2022-05-11 LAB — LIPOPROTEIN A (LPA): Lipoprotein (a): 110.2 nmol/L — ABNORMAL HIGH (ref <75.0)

## 2022-05-11 LAB — HEMOGLOBIN A1C
Hgb A1c MFr Bld: 6.3 % — ABNORMAL HIGH (ref 4.8–5.6)
Mean Plasma Glucose: 134 mg/dL

## 2022-05-11 LAB — BASIC METABOLIC PANEL
Anion gap: 9 (ref 5–15)
BUN: 11 mg/dL (ref 8–23)
CO2: 22 mmol/L (ref 22–32)
Calcium: 8.9 mg/dL (ref 8.9–10.3)
Chloride: 100 mmol/L (ref 98–111)
Creatinine, Ser: 1.13 mg/dL (ref 0.61–1.24)
GFR, Estimated: >60 mL/min (ref >60)
Glucose, Bld: 108 mg/dL — ABNORMAL HIGH (ref 70–99)
Potassium: 3.2 mmol/L — ABNORMAL LOW (ref 3.5–5.1)
Sodium: 131 mmol/L — ABNORMAL LOW (ref 135–145)

## 2022-05-11 LAB — CBC
HCT: 39.6 % (ref 39.0–52.0)
Hemoglobin: 13.4 g/dL (ref 13.0–17.0)
MCH: 29.1 pg (ref 26.0–34.0)
MCHC: 33.8 g/dL (ref 30.0–36.0)
MCV: 86.1 fL (ref 80.0–100.0)
Platelets: 177 10*3/uL (ref 150–400)
RBC: 4.6 MIL/uL (ref 4.22–5.81)
RDW: 13.5 % (ref 11.5–15.5)
WBC: 9.6 10*3/uL (ref 4.0–10.5)
nRBC: 0 % (ref 0.0–0.2)

## 2022-05-11 MED ORDER — AMLODIPINE BESYLATE 5 MG PO TABS
5.0000 mg | ORAL_TABLET | Freq: Every day | ORAL | 3 refills | Status: DC
  Filled 2022-05-11: qty 90, 90d supply, fill #0

## 2022-05-11 MED ORDER — NITROGLYCERIN 0.4 MG SL SUBL
0.4000 mg | SUBLINGUAL_TABLET | SUBLINGUAL | 1 refills | Status: AC | PRN
  Filled 2022-05-11: qty 25, 1d supply, fill #0

## 2022-05-11 MED ORDER — POTASSIUM CHLORIDE CRYS ER 20 MEQ PO TBCR
40.0000 meq | EXTENDED_RELEASE_TABLET | ORAL | Status: AC
  Administered 2022-05-11 (×2): 40 meq via ORAL
  Filled 2022-05-11 (×2): qty 2

## 2022-05-11 MED ORDER — ASPIRIN 81 MG PO CHEW
81.0000 mg | CHEWABLE_TABLET | Freq: Every day | ORAL | 0 refills | Status: AC
  Filled 2022-05-11: qty 30, 30d supply, fill #0

## 2022-05-11 MED ORDER — LOSARTAN POTASSIUM 50 MG PO TABS
50.0000 mg | ORAL_TABLET | Freq: Every day | ORAL | 3 refills | Status: AC
  Filled 2022-05-11: qty 90, 90d supply, fill #0

## 2022-05-11 MED ORDER — AMLODIPINE BESYLATE 5 MG PO TABS
5.0000 mg | ORAL_TABLET | Freq: Every day | ORAL | Status: DC
  Administered 2022-05-11: 5 mg via ORAL
  Filled 2022-05-11: qty 1

## 2022-05-11 MED ORDER — HEART ATTACK BOUNCING BOOK
Freq: Once | Status: AC
  Filled 2022-05-11: qty 1

## 2022-05-11 MED ORDER — HEPARIN (PORCINE) 25000 UT/250ML-% IV SOLN
1400.0000 [IU]/h | INTRAVENOUS | Status: AC
  Administered 2022-05-11: 1400 [IU]/h via INTRAVENOUS
  Filled 2022-05-11: qty 250

## 2022-05-11 MED ORDER — ATORVASTATIN CALCIUM 80 MG PO TABS
80.0000 mg | ORAL_TABLET | Freq: Every day | ORAL | 3 refills | Status: AC
  Filled 2022-05-11: qty 90, 90d supply, fill #0

## 2022-05-11 MED ORDER — CLOPIDOGREL BISULFATE 75 MG PO TABS: 300.0000 mg | ORAL_TABLET | Freq: Once | ORAL | Status: DC

## 2022-05-11 MED ORDER — CLOPIDOGREL BISULFATE 75 MG PO TABS
75.0000 mg | ORAL_TABLET | Freq: Every day | ORAL | 3 refills | Status: DC
  Filled 2022-05-11: qty 90, 90d supply, fill #0

## 2022-05-11 MED ORDER — CLOPIDOGREL BISULFATE 75 MG PO TABS
ORAL_TABLET | ORAL | 3 refills | Status: AC
  Filled 2022-05-11: qty 90, 86d supply, fill #0

## 2022-05-11 MED ORDER — CLOPIDOGREL BISULFATE 75 MG PO TABS: 75.0000 mg | ORAL_TABLET | Freq: Every day | ORAL | Status: DC

## 2022-05-11 MED FILL — Fentanyl Citrate Preservative Free (PF) Inj 100 MCG/2ML: INTRAMUSCULAR | Qty: 2 | Status: AC

## 2022-05-11 NOTE — Discharge Summary (Addendum)
Discharge Summary    Patient ID: JUTIN MISKO MRN: [id number]; DOB: 01-13-1961  Admit date: 05/09/2022 Discharge date: 05/11/2022  PCP:  Administration, [place] Providers Cardiologist:  Berniece Salines, DO      Discharge Diagnoses    Principal Problem:   NSTEMI (non-ST elevated myocardial infarction) [facility]) Active Problems:   CAD (coronary artery disease), native coronary artery   HTN (hypertension)   Hypokalemia   HLD (hyperlipidemia)   History of DVT (deep vein thrombosis)    Diagnostic Studies/Procedures    Left Heart Catheterization 05/10/22   Mid LAD lesion is 50% stenosed.   Dist RCA lesion is 50% stenosed.   Ramus-2 lesion is 99% stenosed.  Thrombotic, culprit lesion.   After aspiration thrombectomy, A drug-eluting stent was successfully placed using a SYNERGY XD 2.50X24 and postdilated with a 2.5 [place] balloon   Post intervention, there is a 0% residual stenosis.   Ramus-1 lesion is 75% stenosed.   A drug-eluting stent was successfully placed using a SYNERGY XD 2.50X16 postdilated with a 2.5 [place] balloon.   Post intervention, there is a 0% residual stenosis.   LV end diastolic pressure is normal.   There is no aortic valve stenosis.   Severe right subclavian tortuosity.  Would not use the right radial in the future if cardiac cath was needed.  Destination sheath was not available today to use no femoral approach was ultimately used successfully.   Diffuse coronary atherosclerosis.  Culprit lesion was a thrombotic subtotal occlusion of the mid ramus vessel.  There is also an ostial ramus lesion.  Both areas were successfully stented.   Brilinta was given in the Cath Lab but the patient is on Eliquis at home.  Could add clopidogrel 300 mg x 1 tomorrow.  The patient could then be on Eliquis 5 mg p.o. twice daily, clopidogrel 75 mg daily for 12 months and aspirin 81 mg daily for 1 month. Antiplatelet therapy duration could be adjusted based on how long  the patient is estimated to be on Eliquis for his pulmonary embolism. Diagnostic Dominance: Right  Intervention     _____________   History of Present Illness     Ricky Peck is a 62 y.o. male with above medical history.  Patient reports that he has been admitted to the hospital due to hypertension in the past, denies ever seeing a cardiologist.  Denies history of chest pain, heart attack, heart failure.  Denies family history of CAD.  He does have a known history of hypertension and is compliant with his home medications.  Infrequently will smoke cigars.  Denies drug use.  Does not take medication for his cholesterol.  No history of diabetes   Patient reports that very early on 3/13 (around 5-6 AM), he started to feel "terrible".  Reports feeling very fatigued and worn out.  Also developed substernal chest pain that was slightly to the right.  Reports that pain was stabbing in nature.  Pain did not radiate.  Also reported feeling very nauseous and having some trouble breathing.  Denies palpitations, syncope, near syncope, dizziness.  His symptoms were constant throughout the day yesterday.  They did seem to worsen throughout the day.  He came to the emergency department after he spoke with his wife and pastor who were very concerned about his symptoms.   Patient presented to the [facility] long ED the evening of 3/13.  At that time, reported that his pain was about an 8/10.  High-sensitivity troponin 4434> 5513.  Potassium 4.0, creatinine 1.11, WBC 7.2, hemoglobin 17.2, platelets 211.  CTA of chest showed no evidence of thoracic or abdominal aortic aneurysm or dissection, coronary artery and mild aortic atherosclerotic calcifications. EKG upon arrival showed sinus tachycardia with heart rate 106 bpm, right bundle branch block present.  Hospital Course     Consultants: None   NSTEMI  Coronary Artery Disease - Patient presented on 3/13 complaining of chest pain, shortness of breath, fatigue,  nausea.  Symptoms had been going on since that morning.  High-sensitivity troponin 4434> 5513. EKG with RBBB (old) - He was started on IV heparin in the ED and IV nitroglycerin. He was evaluated by cardiology on 3/14 and reported feeling well. He is taking eliquis for history of DVT, and his last dose had been the morning of 3/13. Initially planned to hold eliquis and proceed with cardiac catheterization on 3/15, but patient's third troponin (drawn 3/14) returned at 23,690 - Patient was taken to the cath lab on 3/14 with Dr. Irish Lack. Found to have 50% stenosis in mid LAD, 50% stenosis in distal RCA. Also had 99% stenosed lesion in the ramus that was thrombotic and the culprit lesion. Underwent aspiration thrombectomy and DES placement. There was a second lesion in the ostial ramus that was 75% stenosed, and was treated with DES.  - Patient was given Brilinta in the cath lab. However, as patient is on eliquis he is being transitioned to plavix. Instructed patient to take 300 mg plavix at 8 PM the evening of discharge.  - Continue eliquis 5 mg BID and plavix 75 mg daily for at least 1 year. Continue ASA 81 mg daily for 1 month  - Continue carvedilol 12.5 mg BID, amlodipine 5 mg daily  - Continue Lipitor 80 mg daily  - Instructed patient that he should not take SL nitroglycerin within 24 hours of taking medications for ED. - Discussed radial and femoral site care and provided written instructions on discharge summary   HTN  Hypokalemia  - Continue amlodipine 5 mg daily, carvedilol 12.5 mg BID, and losartan 50 mg daily  - Note, K was 3.2 on 3/15 and was given 80 mEq potassium prior to DC. Will need BMP at follow up appointment. May benefit from spironolactone if hypokalemia persists   HLD  - Lipid panel this admission showed LDL 195, HDL 39, triglycerides 78, total cholesterol 250  - Started lipitor 80 mg daily this admission  - Needs LFTs and lipid panel in 8 weeks   History of DVT - Continue  Eliquis 5 mg BID    Patient was seen and examined by Dr. Oval Linsey and was deemed stable for discharge.   Patient has a follow up appointment on 4/2  Did the patient have an acute coronary syndrome (MI, NSTEMI, STEMI, etc) this admission?:  Yes                               AHA/ACC Clinical Performance & Quality Measures: Aspirin prescribed? - Yes ADP Receptor Inhibitor (Plavix/Clopidogrel, Brilinta/Ticagrelor or Effient/Prasugrel) prescribed (includes medically managed patients)? - Yes Beta Blocker prescribed? - Yes High Intensity Statin (Lipitor 40-80mg  or Crestor 20-40mg ) prescribed? - Yes EF assessed during THIS hospitalization? - Yes For EF <40%, was ACEI/ARB prescribed? - Not Applicable (EF >/= [id number]) For EF <40%, Aldosterone Antagonist (Spironolactone or Eplerenone) prescribed? - Not Applicable (EF >/= [id number]) Cardiac Rehab Phase II ordered (including medically managed  patients)? - Yes      _____________  Discharge Vitals Blood pressure (!) 134/94, pulse 91, temperature 97.6 F (36.4 C), temperature source Oral, resp. rate 19, height 5\' 7"  (1.702 m), weight 89.8 kg, SpO2 98 %.  Filed Weights   05/09/22 2124 05/10/22 2047  Weight: 86.2 kg 89.8 kg    Labs & Radiologic Studies    CBC Recent Labs    05/10/22 0336 05/11/22 0221  WBC 7.6 9.6  HGB 14.8 13.4  HCT 46.3 39.6  MCV 89.0 86.1  PLT 192 [id number]   Basic Metabolic Panel Recent Labs    05/10/22 0336 05/11/22 0221  NA 134* 131*  K 3.4* 3.2*  CL 97* 100  CO2 22 22  GLUCOSE 184* 108*  BUN 10 11  CREATININE 0.93 1.13  CALCIUM 9.7 8.9   Liver Function Tests No results for input(s): "AST", "ALT", "ALKPHOS", "BILITOT", "PROT", "ALBUMIN" in the last 72 hours. No results for input(s): "LIPASE", "AMYLASE" in the last 72 hours. High Sensitivity Troponin:   Recent Labs  Lab 05/09/22 2152 05/10/22 0015 05/10/22 0925  TROPONINIHS 4,434* 5,513* 23,690*    BNP Invalid input(s): "POCBNP" D-Dimer No results for  input(s): "DDIMER" in the last 72 hours. Hemoglobin A1C Recent Labs    05/10/22 0336  HGBA1C 6.3*   Fasting Lipid Panel Recent Labs    05/10/22 0334  CHOL 250*  HDL 39*  LDLCALC 195*  TRIG 78  CHOLHDL 6.4   Thyroid Function Tests Recent Labs    05/10/22 0335  TSH 3.360   _____________  CARDIAC CATHETERIZATION  Result Date: 05/10/2022   Mid LAD lesion is 50% stenosed.   Dist RCA lesion is 50% stenosed.   Ramus-2 lesion is 99% stenosed.  Thrombotic, culprit lesion.   After aspiration thrombectomy, A drug-eluting stent was successfully placed using a SYNERGY XD 2.50X24 and postdilated with a 2.5 [place] balloon   Post intervention, there is a 0% residual stenosis.   Ramus-1 lesion is 75% stenosed.   A drug-eluting stent was successfully placed using a SYNERGY XD 2.50X16 postdilated with a 2.5 [place] balloon.   Post intervention, there is a 0% residual stenosis.   LV end diastolic pressure is normal.   There is no aortic valve stenosis.   Severe right subclavian tortuosity.  Would not use the right radial in the future if cardiac cath was needed.  Destination sheath was not available today to use no femoral approach was ultimately used successfully. Diffuse coronary atherosclerosis.  Culprit lesion was a thrombotic subtotal occlusion of the mid ramus vessel.  There is also an ostial ramus lesion.  Both areas were successfully stented. Brilinta was given in the Cath Lab but the patient is on Eliquis at home.  Could add clopidogrel 300 mg x 1 tomorrow.  The patient could then be on Eliquis 5 mg p.o. twice daily, clopidogrel 75 mg daily for 12 months and aspirin 81 mg daily for 1 month. Antiplatelet therapy duration could be adjusted based on how long the patient is estimated to be on Eliquis for his pulmonary embolism.   CT Angio Chest/Abd/Pel for Dissection W and/or Wo Contrast  Result Date: 05/09/2022 CLINICAL DATA:  Acute aortic syndrome suspected EXAM: CT ANGIOGRAPHY CHEST, ABDOMEN AND PELVIS  TECHNIQUE: Non-contrast CT of the chest was initially obtained. Multidetector CT imaging through the chest, abdomen and pelvis was performed using the standard protocol during bolus administration of intravenous contrast. Multiplanar reconstructed images and MIPs were obtained and reviewed to evaluate the  vascular anatomy. RADIATION DOSE REDUCTION: This exam was performed according to the departmental dose-optimization program which includes automated exposure control, adjustment of the mA and/or kV according to patient size and/or use of iterative reconstruction technique. CONTRAST:  151mL OMNIPAQUE IOHEXOL 350 MG/ML SOLN COMPARISON:  None Available. FINDINGS: CTA CHEST FINDINGS Cardiovascular: Preferential opacification of the thoracic aorta. No evidence of thoracic aortic aneurysm or dissection. Normal heart size. Coronary artery atherosclerotic calcifications atherosclerotic calcification of the aortic arch. No pericardial effusion. Mediastinum/Nodes: No enlarged mediastinal, hilar, or axillary lymph nodes. Thyroid gland, trachea, and esophagus demonstrate no significant findings. Lungs/Pleura: Bibasilar dependent atelectasis. No acute pulmonary process. No pleural effusion or pneumothorax. Musculoskeletal: No chest wall abnormality. No acute or significant osseous findings. Review of the MIP images confirms the above findings. CTA ABDOMEN AND PELVIS FINDINGS VASCULAR Aorta: Normal caliber aorta without aneurysm, dissection, vasculitis or significant stenosis. Celiac: Patent without evidence of aneurysm, dissection, vasculitis or significant stenosis. SMA: Patent without evidence of aneurysm, dissection, vasculitis or significant stenosis. Renals: Both renal arteries are patent without evidence of aneurysm, dissection, vasculitis, fibromuscular dysplasia or significant stenosis. IMA: Patent without evidence of aneurysm, dissection, vasculitis or significant stenosis. Inflow: Patent without evidence of aneurysm,  dissection, vasculitis or significant stenosis. Veins: No obvious venous abnormality within the limitations of this arterial phase study. Review of the MIP images confirms the above findings. NON-VASCULAR Hepatobiliary: No focal liver abnormality is seen. No gallstones, gallbladder wall thickening, or biliary dilatation. Pancreas: Unremarkable. No pancreatic ductal dilatation or surrounding inflammatory changes. Spleen: Normal in size without focal abnormality. Adrenals/Urinary Tract: Adrenal glands are unremarkable. Kidneys are normal, without renal calculi, focal lesion, or hydronephrosis. Bladder is unremarkable. Stomach/Bowel: Stomach is within normal limits. Appendix appears normal. No evidence of bowel wall thickening, distention, or inflammatory changes. Lymphatic: No lymphadenopathy Reproductive: Prostate is mildly enlarged measuring 4.9 cm in transverse dimension Other: No abdominal wall hernia or abnormality. No abdominopelvic ascites. Musculoskeletal: No acute or significant osseous findings. 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 No osseous lesion Review of the MIP images confirms the above findings. IMPRESSION: 1. No evidence of thoracic or abdominal aortic aneurysm or dissection. 2. Coronary artery and mild aortic atherosclerotic calcifications. 3. No acute intrathoracic, abdominal or pelvic process. 4. 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 5. Mild prostatomegaly. Electronically Signed   By: Keane Police D.O.   On: 05/09/2022 23:33   DG Chest 2 View  Result Date: 05/09/2022 CLINICAL DATA:  Cp.  Mid chest pain, vomiting EXAM: CHEST - 2 VIEW COMPARISON:  Chest x-ray 09/18/2013 FINDINGS: The heart and mediastinal contours are unchanged. Aortic calcification. No focal consolidation. No pulmonary edema. No pleural effusion. No pneumothorax. No acute osseous abnormality. IMPRESSION: 1. No  active cardiopulmonary disease. 2.  Aortic Atherosclerosis (ICD10-I70.0). Electronically Signed   By: Iven Finn M.D.   On: 05/09/2022 21:51   Disposition   Pt is being discharged home today in good condition.  Follow-up Plans & Appointments     Follow-up Information     Lenna Sciara, NP Follow up on 05/29/2022.   Specialties: Cardiology, Family Medicine Why: Appointment at 10:55 AM Contact information: [address] [place] [address] [phone]                Discharge Instructions     Amb Referral to Cardiac Rehabilitation   Complete by: As directed    Diagnosis:  Coronary Stents  NSTEMI     After initial evaluation and assessments completed: Virtual Based Care may be provided alone or in conjunction with Phase 2 Cardiac Rehab based on patient barriers.: Yes   Intensive Cardiac Rehabilitation (ICR) [place] location only OR Traditional Cardiac Rehabilitation (TCR) *If criteria for ICR are not met will enroll in TCR [facility] only): Yes   Diet - low sodium heart healthy   Complete by: As directed    Discharge instructions   Complete by: As directed    Radial Site Care Refer to this sheet in the next few weeks. These instructions provide you with information on caring for yourself after your procedure. Your caregiver may also give you more specific instructions. Your treatment has been planned according to current medical practices, but problems sometimes occur. Call your caregiver if you have any problems or questions after your procedure. HOME CARE INSTRUCTIONS You may shower the day after the procedure. Remove the bandage (dressing) and gently wash the site with plain soap and water. Gently pat the site dry.  Do not apply powder or lotion to the site.  Do not submerge the affected site in water for 3 to 5 days.  Inspect the site at least twice daily.  Do not flex or bend the affected arm for 24 hours.  No lifting over 5 pounds (2.3 kg) for 5 days after  your procedure.  Do not drive home if you are discharged the same day of the procedure. Have someone else drive you.  You may drive 24 hours after the procedure unless otherwise instructed by your caregiver.  What to expect: Any bruising will usually fade within 1 to 2 weeks.  Blood that collects in the tissue (hematoma) may be painful to the touch. It should usually decrease in size and tenderness within 1 to 2 weeks.  SEEK IMMEDIATE MEDICAL CARE IF: You have unusual pain at the radial site.  You have redness, warmth, swelling, or pain at the radial site.  You have drainage (other than a small amount of blood on the dressing).  You have chills.  You have a fever or persistent symptoms for more than 72 hours.  You have a fever and your symptoms suddenly get worse.  Your arm becomes pale, cool, tingly, or numb.  You have heavy bleeding from the site. Hold pressure on the site.   Groin Site Care Refer to this sheet in the next few weeks. These instructions provide you with information on caring for yourself after your procedure. Your caregiver may also give you more specific instructions. Your treatment has been planned according to current medical practices, but problems sometimes occur. Call your caregiver if you have any problems or questions after your procedure. HOME CARE INSTRUCTIONS You may shower 24 hours after the procedure. Remove the bandage (dressing) and gently wash the site with plain soap and water. Gently pat the site dry.  Do not apply powder or lotion to the site.  Do not sit in a bathtub, swimming pool, or whirlpool for 5 to 7 days.  No bending, squatting, or lifting anything over 10 pounds (4.5 kg) as directed by your caregiver.  Inspect the site at least twice daily.  Do not drive home if you are discharged the same day of the procedure. Have someone else drive you.  You may drive 24 hours after the procedure unless otherwise instructed by your caregiver.  What to  expect: Any bruising will usually fade within 1 to 2 weeks.  Blood that  collects in the tissue (hematoma) may be painful to the touch. It should usually decrease in size and tenderness within 1 to 2 weeks.  SEEK IMMEDIATE MEDICAL CARE IF: You have unusual pain at the groin site or down the affected leg.  You have redness, warmth, swelling, or pain at the groin site.  You have drainage (other than a small amount of blood on the dressing).  You have chills.  You have a fever or persistent symptoms for more than 72 hours.  You have a fever and your symptoms suddenly get worse.  Your leg becomes pale, cool, tingly, or numb.  You have heavy bleeding from the site. Hold pressure on the site. Marland Kitchen  PLEASE DO NOT MISS ANY DOSES OF YOUR PLAVIX!!!!! Also keep a log of you blood pressures and bring back to your follow up appt. Please call the office with any questions.   Patients taking blood thinners should generally stay away from medicines like ibuprofen, Advil, Motrin, naproxen, and Aleve due to risk of stomach bleeding. You may take Tylenol as directed or talk to your primary doctor about alternatives.   PLEASE ENSURE THAT YOU DO NOT RUN OUT OF YOUR PLAVIX. This medication is very important to remain on for at least one year. IF you have issues obtaining this medication due to cost please CALL the office 3-5 business days prior to running out in order to prevent missing doses of this medication.   Increase activity slowly   Complete by: As directed         Discharge Medications   Allergies as of 05/11/2022       Reactions   Lisinopril Other (See Comments)        Medication List     STOP taking these medications    benzonatate 100 MG capsule Commonly known as: TESSALON       TAKE these medications    amLODipine 5 MG tablet Commonly known as: NORVASC Take 1 tablet (5 mg total) by mouth daily. What changed:  medication strength how much to take   aspirin 81 MG chewable  tablet Chew 1 tablet (81 mg total) by mouth daily. Take for 30 days then stop Start taking on: May 12, 2022   atorvastatin 80 MG tablet Commonly known as: LIPITOR Take 1 tablet (80 mg total) by mouth daily. Start taking on: May 12, 2022   carvedilol 25 MG tablet Commonly known as: COREG Take 12.5 mg by mouth 2 (two) times daily with a meal.   clopidogrel 75 MG tablet Commonly known as: PLAVIX Take 4 tablet once at 8 PM on 3/15. Then, take 75 mg (one tablet) daily Start taking on: May 12, 2022   Eliquis 5 MG Tabs tablet Generic drug: apixaban Take 5 mg by mouth 2 (two) times daily.   losartan 50 MG tablet Commonly known as: COZAAR Take 1 tablet (50 mg total) by mouth daily. Start taking on: May 12, 2022 What changed:  medication strength how much to take when to take this Another medication with the same name was removed. Continue taking this medication, and follow the directions you see here.   nitroGLYCERIN 0.4 MG SL tablet Commonly known as: Nitrostat Place 1 tablet (0.4 mg total) under the tongue every 5 (five) minutes as needed for chest pain.           Outstanding Labs/Studies    Duration of Discharge Encounter   Greater than 30 minutes including physician time.  Signed, Margie Billet,  PA-C 05/11/2022, 12:02 PM

## 2022-05-11 NOTE — Progress Notes (Signed)
CARDIAC REHAB PHASE I   PRE:  Rate/Rhythm: 87 SR   BP:  Sitting: 144/110      SaO2: 98 RA  MODE:  Ambulation: 200 ft   POST:  Rate/Rhythm: 91 SR  BP:  Sitting: 134/94      SaO2: 96 RA  Pt ambulated in hall using front wheel walker for balance. Tolerated well with no CP, SOB or dizziness. Returned to chair with call bell and bedside table in reach. Post MI/stent education including site care, restrictions, risk factors, exercise guidelines, antiplatelet therapy importance, MI booklet, heart heathy diet and CRP2 reviewed. All questions and concerns addressed. Will refer to [facility] for CRP2. Will continue to follow.  [id number]  Vanessa Barbara, RN BSN 05/11/2022 9:08 AM

## 2022-05-11 NOTE — Care Management (Addendum)
  Transition of Care [facility]) Screening Note   Patient Details  Name: Ricky Peck Date of Birth: 1960/07/30   Transition of Care [facility]) CM/SW Contact:    Bethena Roys, RN Phone Number: 05/11/2022, 9:50 AM    Transition of Care Department [facility]) has reviewed the patient and no TOC needs have been identified at this time. Case Manager did call the [place] Coordinator to make them aware of hospitalization. Awaiting call back from the [place]. We will continue to monitor patient advancement through interdisciplinary progression rounds. If new patient transition needs arise, please place a TOC consult.   1204 05-11-22 Case Manager received a call from [place] Coordinator: PCP at the [place] is [place] and CSW is Cyndee Brightly [phone] ext [phone] for additional transition of care needs.

## 2022-05-11 NOTE — Progress Notes (Signed)
  Echocardiogram 2D Echocardiogram has been performed.  Ricky Peck 05/11/2022, 10:20 AM

## 2022-05-11 NOTE — Progress Notes (Signed)
Rounding Note    Patient Name: Ricky Peck Date of Encounter: 05/11/2022  [place] Cardiologist: Berniece Salines, DO   Subjective   Feeling well.  Eager to go home.   Inpatient Medications    Scheduled Meds:  apixaban  5 mg Oral BID   aspirin  81 mg Oral Daily   atorvastatin  80 mg Oral Daily   carvedilol  12.5 mg Oral BID WC   losartan  50 mg Oral Daily   sodium chloride flush  3 mL Intravenous Q12H   ticagrelor  90 mg Oral BID   Continuous Infusions:  sodium chloride     heparin 1,400 Units/hr (05/11/22 0355)   nitroGLYCERIN 20 mcg/min (05/11/22 0639)   PRN Meds: sodium chloride, acetaminophen, ondansetron (ZOFRAN) IV, ondansetron (ZOFRAN) IV, sodium chloride flush   Vital Signs    Vitals:   05/11/22 0413 05/11/22 0510 05/11/22 0630 05/11/22 0900  BP: (!) 125/94 128/78 (!) 146/101 (!) 134/94  Pulse: 91 75 77   Resp: 17   19  Temp: 98.5 F (36.9 C)   97.6 F (36.4 C)  TempSrc: Oral   Oral  SpO2: 97% 96% 96%   Weight:      Height:        Intake/Output Summary (Last 24 hours) at 05/11/2022 0948 Last data filed at 05/11/2022 0916 Gross per 24 hour  Intake 411.6 ml  Output 630 ml  Net -218.4 ml      05/10/2022    8:47 PM 05/09/2022    9:24 PM 12/09/2019    7:49 AM  Last 3 Weights  Weight (lbs) 197 lb 15.6 oz 190 lb 195 lb  Weight (kg) 89.8 kg 86.183 kg 88.451 kg      Telemetry    Sinus rhythm. Sinus tachycardia.  - Personally Reviewed  ECG    Sinus rhythm.  Rate 85 bpm.  RBBB. - Personally Reviewed  Physical Exam   VS:  BP (!) 134/94 (BP Location: Left Arm)   Pulse 77   Temp 97.6 F (36.4 C) (Oral)   Resp 19   Ht 5\' 7"  (1.702 m)   Wt 89.8 kg   SpO2 96%   BMI 31.01 kg/m  , BMI Body mass index is 31.01 kg/m. GENERAL:  Well appearing HEENT: Pupils equal round and reactive, fundi not visualized, oral mucosa unremarkable NECK:  No jugular venous distention, waveform within normal limits, carotid upstroke brisk and symmetric,  no bruits, no thyromegaly LUNGS:  Clear to auscultation bilaterally HEART:  RRR.  PMI not displaced or sustained,S1 and S2 within normal limits, no S3, no S4, no clicks, no rubs, no murmurs ABD:  Flat, positive bowel sounds normal in frequency in pitch, no bruits, no rebound, no guarding, no midline pulsatile mass, no hepatomegaly, no splenomegaly EXT:  2 plus pulses throughout, no edema, no cyanosis no clubbing SKIN:  No rashes no nodules NEURO:  Cranial nerves II through XII grossly intact, motor grossly intact throughout PSYCH:  Cognitively intact, oriented to person place and time   Labs    High Sensitivity Troponin:   Recent Labs  Lab 05/09/22 2152 05/10/22 0015 05/10/22 0925  TROPONINIHS 4,434* 5,513* 23,690*     Chemistry Recent Labs  Lab 05/09/22 2152 05/10/22 0336 05/11/22 0221  NA 133* 134* 131*  K 4.0 3.4* 3.2*  CL 95* 97* 100  CO2 23 22 22   GLUCOSE 134* 184* 108*  BUN 10 10 11   CREATININE 1.11 0.93 1.13  CALCIUM 10.0  9.7 8.9  GFRNONAA >60 >60 >60  ANIONGAP 15 15 9     Lipids  Recent Labs  Lab 05/10/22 0334  CHOL 250*  TRIG 78  HDL 39*  LDLCALC 195*  CHOLHDL 6.4    Hematology Recent Labs  Lab 05/09/22 2152 05/10/22 0336 05/11/22 0221  WBC 7.2 7.6 9.6  RBC 5.96* 5.20 4.60  HGB 17.2* 14.8 13.4  HCT 52.7* 46.3 39.6  MCV 88.4 89.0 86.1  MCH 28.9 28.5 29.1  MCHC 32.6 32.0 33.8  RDW 13.3 13.4 13.5  PLT 211 192 177   Thyroid  Recent Labs  Lab 05/10/22 0335  TSH 3.360    BNPNo results for input(s): "BNP", "PROBNP" in the last 168 hours.  DDimer No results for input(s): "DDIMER" in the last 168 hours.   Radiology    CARDIAC CATHETERIZATION  Result Date: 05/10/2022   Mid LAD lesion is 50% stenosed.   Dist RCA lesion is 50% stenosed.   Ramus-2 lesion is 99% stenosed.  Thrombotic, culprit lesion.   After aspiration thrombectomy, A drug-eluting stent was successfully placed using a SYNERGY XD 2.50X24 and postdilated with a 2.5 [place] balloon   Post  intervention, there is a 0% residual stenosis.   Ramus-1 lesion is 75% stenosed.   A drug-eluting stent was successfully placed using a SYNERGY XD 2.50X16 postdilated with a 2.5 [place] balloon.   Post intervention, there is a 0% residual stenosis.   LV end diastolic pressure is normal.   There is no aortic valve stenosis.   Severe right subclavian tortuosity.  Would not use the right radial in the future if cardiac cath was needed.  Destination sheath was not available today to use no femoral approach was ultimately used successfully. Diffuse coronary atherosclerosis.  Culprit lesion was a thrombotic subtotal occlusion of the mid ramus vessel.  There is also an ostial ramus lesion.  Both areas were successfully stented. Brilinta was given in the Cath Lab but the patient is on Eliquis at home.  Could add clopidogrel 300 mg x 1 tomorrow.  The patient could then be on Eliquis 5 mg p.o. twice daily, clopidogrel 75 mg daily for 12 months and aspirin 81 mg daily for 1 month. Antiplatelet therapy duration could be adjusted based on how long the patient is estimated to be on Eliquis for his pulmonary embolism.   CT Angio Chest/Abd/Pel for Dissection W and/or Wo Contrast  Result Date: 05/09/2022 CLINICAL DATA:  Acute aortic syndrome suspected EXAM: CT ANGIOGRAPHY CHEST, ABDOMEN AND PELVIS TECHNIQUE: Non-contrast CT of the chest was initially obtained. Multidetector CT imaging through the chest, abdomen and pelvis was performed using the standard protocol during bolus administration of intravenous contrast. Multiplanar reconstructed images and MIPs were obtained and reviewed to evaluate the vascular anatomy. RADIATION DOSE REDUCTION: This exam was performed according to the departmental dose-optimization program which includes automated exposure control, adjustment of the mA and/or kV according to patient size and/or use of iterative reconstruction technique. CONTRAST:  14mL OMNIPAQUE IOHEXOL 350 MG/ML SOLN COMPARISON:  None  Available. FINDINGS: CTA CHEST FINDINGS Cardiovascular: Preferential opacification of the thoracic aorta. No evidence of thoracic aortic aneurysm or dissection. Normal heart size. Coronary artery atherosclerotic calcifications atherosclerotic calcification of the aortic arch. No pericardial effusion. Mediastinum/Nodes: No enlarged mediastinal, hilar, or axillary lymph nodes. Thyroid gland, trachea, and esophagus demonstrate no significant findings. Lungs/Pleura: Bibasilar dependent atelectasis. No acute pulmonary process. No pleural effusion or pneumothorax. Musculoskeletal: No chest wall abnormality. No acute or significant osseous findings. Review  of the MIP images confirms the above findings. CTA ABDOMEN AND PELVIS FINDINGS VASCULAR Aorta: Normal caliber aorta without aneurysm, dissection, vasculitis or significant stenosis. Celiac: Patent without evidence of aneurysm, dissection, vasculitis or significant stenosis. SMA: Patent without evidence of aneurysm, dissection, vasculitis or significant stenosis. Renals: Both renal arteries are patent without evidence of aneurysm, dissection, vasculitis, fibromuscular dysplasia or significant stenosis. IMA: Patent without evidence of aneurysm, dissection, vasculitis or significant stenosis. Inflow: Patent without evidence of aneurysm, dissection, vasculitis or significant stenosis. Veins: No obvious venous abnormality within the limitations of this arterial phase study. Review of the MIP images confirms the above findings. NON-VASCULAR Hepatobiliary: No focal liver abnormality is seen. No gallstones, gallbladder wall thickening, or biliary dilatation. Pancreas: Unremarkable. No pancreatic ductal dilatation or surrounding inflammatory changes. Spleen: Normal in size without focal abnormality. Adrenals/Urinary Tract: Adrenal glands are unremarkable. Kidneys are normal, without renal calculi, focal lesion, or hydronephrosis. Bladder is unremarkable. Stomach/Bowel: Stomach is  within normal limits. Appendix appears normal. No evidence of bowel wall thickening, distention, or inflammatory changes. Lymphatic: No lymphadenopathy Reproductive: Prostate is mildly enlarged measuring 4.9 cm in transverse dimension Other: No abdominal wall hernia or abnormality. No abdominopelvic ascites. Musculoskeletal: No acute or significant osseous findings. 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 No osseous lesion Review of the MIP images confirms the above findings. IMPRESSION: 1. No evidence of thoracic or abdominal aortic aneurysm or dissection. 2. Coronary artery and mild aortic atherosclerotic calcifications. 3. No acute intrathoracic, abdominal or pelvic process. 4. 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 5. Mild prostatomegaly. Electronically Signed   By: Keane Police D.O.   On: 05/09/2022 23:33   DG Chest 2 View  Result Date: 05/09/2022 CLINICAL DATA:  Cp.  Mid chest pain, vomiting EXAM: CHEST - 2 VIEW COMPARISON:  Chest x-ray 09/18/2013 FINDINGS: The heart and mediastinal contours are unchanged. Aortic calcification. No focal consolidation. No pulmonary edema. No pleural effusion. No pneumothorax. No acute osseous abnormality. IMPRESSION: 1. No active cardiopulmonary disease. 2.  Aortic Atherosclerosis (ICD10-I70.0). Electronically Signed   By: Iven Finn M.D.   On: 05/09/2022 21:51    Cardiac Studies   LHC 05/10/22:   Mid LAD lesion is 50% stenosed.   Dist RCA lesion is 50% stenosed.   Ramus-2 lesion is 99% stenosed.  Thrombotic, culprit lesion.   After aspiration thrombectomy, A drug-eluting stent was successfully placed using a SYNERGY XD 2.50X24 and postdilated with a 2.5 [place] balloon   Post intervention, there is a 0% residual stenosis.   Ramus-1 lesion is 75% stenosed.   A drug-eluting stent was successfully placed using a SYNERGY XD 2.50X16 postdilated with a 2.5 [place]  balloon.   Post intervention, there is a 0% residual stenosis.   LV end diastolic pressure is normal.   There is no aortic valve stenosis.   Severe right subclavian tortuosity.  Would not use the right radial in the future if cardiac cath was needed.  Destination sheath was not available today to use no femoral approach was ultimately used successfully.   Diffuse coronary atherosclerosis.  Culprit lesion was a thrombotic subtotal occlusion of the mid ramus vessel.  There is also an ostial ramus lesion.  Both areas were successfully stented.   Brilinta was given in the Cath Lab but the patient is on Eliquis at home.  Could add clopidogrel 300 mg x 1 tomorrow.  The patient could then be on Eliquis  5 mg p.o. twice daily, clopidogrel 75 mg daily for 12 months and aspirin 81 mg daily for 1 month. Antiplatelet therapy duration could be adjusted based on how long the patient is estimated to be on Eliquis for his pulmonary embolism.    Echo pending  Patient Profile     63 y.o. male with hypertension, hyperlipidemia, OSA, and prior DVT admitted with NSTEMI.  Assessment & Plan    # NSTEMI:  # HL:  # DVT: Patient underwent successful PCI of ramus to lesion on 05/10/2022.  He has 50% mid LAD, 50% distal RCA, and 75% ramus 1 lesions.  Medical management for these.  Given that he is on Eliquis at baseline for DVT, ticagrelor switched to clopidogrel today.  Plan to continue clopidogrel and Eliquis for 1 year.  Aspirin 81 mg for 1 month.  LDL was 195 on admission.  He was started on atorvastatin 80.  Will need repeat fasting lipids and CMP in 2 to 3 months.  Continue carvedilol.  # HTN: At home he was on amlodipine, carvedilol, and losartan.  Home amlodipine currently on hold.  He has been consistently hypokalemic.  Would consider switching amlodipine to spironolactone if this persists as an outpatient.  Echocardiogram pending.  Dispo: Home today.  Patient gets care and meds at [facility] but will f/u with [facility], at least initially.  For questions or updates, please contact [facility] Please consult [email or web address] for contact info under        Signed, Skeet Latch, MD  05/11/2022, 9:48 AM

## 2022-05-11 NOTE — Progress Notes (Signed)
ANTICOAGULATION CONSULT NOTE - Follow Up Consult  Pharmacy Consult for heparin Indication:  h/o PE  Labs: Recent Labs    05/09/22 2152 05/10/22 0015 05/10/22 0336 05/10/22 0810 05/10/22 0925  HGB 17.2*  --  14.8  --   --   HCT 52.7*  --  46.3  --   --   PLT 211  --  192  --   --   APTT  --   --   --  44*  --   HEPARINUNFRC  --   --   --  0.43  --   CREATININE 1.11  --  0.93  --   --   TROPONINIHS [id number]*  --   --  23,690*    Assessment: 62yo male on Eliquis PTA for h/o DVT/PE/chronic thrombus, now s/p cardiac cath, to resume heparin until DOAC resumed.  Goal of Therapy:  Heparin level 0.3-0.7 units/ml aPTT 66-102 seconds   Plan:  Will restart heparin infusion at 1400 units/hr and check level if transition back to [place] is delayed.    Wynona Neat, PharmD, BCPS  05/11/2022,3:08 AM

## 2022-05-11 NOTE — Progress Notes (Signed)
Mobility Specialist Progress Note:   05/11/22 1200  Mobility  Activity Ambulated with assistance in hallway  Level of Assistance Contact guard assist, steadying assist  Assistive Device Front wheel walker  Distance Ambulated (ft) 200 ft  Activity Response Tolerated well  Mobility Referral Yes  $Mobility charge 1 Mobility   Pt agreeable to mobility session. Required minG assist during ambulation, d/t tendency to veer left. Pt with no c/o throughout. Back in bed with all needs met, eager for d/c.  Nelta Numbers Mobility Specialist Please contact via SecureChat or  Rehab office at [phone]

## 2022-05-29 ENCOUNTER — Ambulatory Visit: Payer: No Typology Code available for payment source | Attending: Nurse Practitioner | Admitting: Nurse Practitioner

## 2022-05-29 ENCOUNTER — Encounter: Payer: Self-pay | Admitting: Nurse Practitioner

## 2022-05-29 VITALS — BP 160/100 | HR 63 | Ht 67.0 in | Wt 198.0 lb

## 2022-05-29 DIAGNOSIS — I10 Essential (primary) hypertension: Secondary | ICD-10-CM | POA: Diagnosis not present

## 2022-05-29 DIAGNOSIS — Z86718 Personal history of other venous thrombosis and embolism: Secondary | ICD-10-CM | POA: Diagnosis not present

## 2022-05-29 DIAGNOSIS — G4733 Obstructive sleep apnea (adult) (pediatric): Secondary | ICD-10-CM

## 2022-05-29 DIAGNOSIS — I2510 Atherosclerotic heart disease of native coronary artery without angina pectoris: Secondary | ICD-10-CM

## 2022-05-29 DIAGNOSIS — E785 Hyperlipidemia, unspecified: Secondary | ICD-10-CM | POA: Diagnosis not present

## 2022-05-29 MED ORDER — AMLODIPINE BESYLATE 10 MG PO TABS: 10.0000 mg | ORAL_TABLET | Freq: Every day | ORAL | 3 refills | Status: AC

## 2022-05-29 NOTE — Patient Instructions (Addendum)
Medication Instructions:  Start taking Amlodipine 10 MG daily  *If you need a refill on your cardiac medications before your next appointment, please call your pharmacy*   Lab Work: Your physician recommends that you have lab work today:  BMET  If you have labs (blood work) drawn today and your tests are completely normal, you will receive your results only by: [employer] (if you have MyChart) OR A paper copy in the mail If you have any lab test that is abnormal or we need to change your treatment, we will call you to review the results.   Testing/Procedures: None ordered   Follow-Up: At [facility], you and your health needs are our priority.  As part of our continuing mission to provide you with exceptional heart care, we have created designated Provider Care Teams.  These Care Teams include your primary Cardiologist (physician) and Advanced Practice Providers (APPs -  Physician Assistants and Nurse Practitioners) who all work together to provide you with the care you need, when you need it.  We recommend signing up for the patient portal called "MyChart".  Sign up information is provided on this After Visit Summary.  MyChart is used to connect with patients for Virtual Visits (Telemedicine).  Patients are able to view lab/test results, encounter notes, upcoming appointments, etc.  Non-urgent messages can be sent to your provider as well.   To learn more about what you can do with MyChart, go to [email or web address].    Your next appointment:   6 week(s) Morning Appointment Please. Fast on the morning of your next appointment.  Provider:   Diona Browner, NP      Other Instructions Please take your blood pressure daily for 2 weeks and Report to your provider a consistent blood pressure above 130/80. Please include heart rates.   HOW TO TAKE YOUR BLOOD PRESSURE: Rest 5-10 minutes before taking your blood pressure. Don't smoke or drink caffeinated beverages for at  least 30 minutes before. Take your blood pressure before (not after) you eat. Sit comfortably with your back supported and both feet on the floor (don't cross your legs). Elevate your arm to heart level on a table or a desk. Use the proper sized cuff. It should fit smoothly and snugly around your bare upper arm. There should be enough room to slip a fingertip under the cuff. The bottom edge of the cuff should be 1 inch above the crease of the elbow. Ideally, take 3 measurements at one sitting and record the average.

## 2022-05-29 NOTE — Progress Notes (Signed)
Office Visit    Patient Name: GERGORY REALI Date of Encounter: 05/29/2022  Primary Care Provider:  [facility] Cardiologist:  Berniece Salines, DO  Chief Complaint    62 year old male with a history of CAD s/p NSTEMI, DESx2-RI in 04/2022, hypertension, hyperlipidemia, hypokalemia, DVT, and OSA on CPAP who presents for hospital follow-up related to CAD.  Past Medical History    Past Medical History:  Diagnosis Date   Allergy    Arthritis    Hyperlipidemia    Hypertension    Sleep apnea    cpap   Past Surgical History:  Procedure Laterality Date   CORONARY STENT INTERVENTION N/A 05/10/2022   Procedure: CORONARY STENT INTERVENTION;  Surgeon: Jettie Booze, MD;  Location: [facility];  Service: Cardiovascular;  Laterality: N/A;   CORONARY THROMBECTOMY N/A 05/10/2022   Procedure: Coronary Thrombectomy;  Surgeon: Jettie Booze, MD;  Location: [facility];  Service: Cardiovascular;  Laterality: N/A;   LEFT HEART CATH AND CORONARY ANGIOGRAPHY N/A 05/10/2022   Procedure: LEFT HEART CATH AND CORONARY ANGIOGRAPHY;  Surgeon: Jettie Booze, MD;  Location: [facility];  Service: Cardiovascular;  Laterality: N/A;   quadricep surgery  2014    Allergies  Allergies  Allergen Reactions   Lisinopril Other (See Comments)     Labs/Other Studies Reviewed    The following studies were reviewed today:  Left Heart Catheterization 05/10/22   Mid LAD lesion is 50% stenosed.   Dist RCA lesion is 50% stenosed.   Ramus-2 lesion is 99% stenosed.  Thrombotic, culprit lesion.   After aspiration thrombectomy, A drug-eluting stent was successfully placed using a SYNERGY XD 2.50X24 and postdilated with a 2.5 [place] balloon   Post intervention, there is a 0% residual stenosis.   Ramus-1 lesion is 75% stenosed.   A drug-eluting stent was successfully placed using a SYNERGY XD 2.50X16 postdilated with a 2.5 [place] balloon.   Post intervention, there is a  0% residual stenosis.   LV end diastolic pressure is normal.   There is no aortic valve stenosis.   Severe right subclavian tortuosity.  Would not use the right radial in the future if cardiac cath was needed.  Destination sheath was not available today to use no femoral approach was ultimately used successfully.   Diffuse coronary atherosclerosis.  Culprit lesion was a thrombotic subtotal occlusion of the mid ramus vessel.  There is also an ostial ramus lesion.  Both areas were successfully stented.   Brilinta was given in the Cath Lab but the patient is on Eliquis at home.  Could add clopidogrel 300 mg x 1 tomorrow.  The patient could then be on Eliquis 5 mg p.o. twice daily, clopidogrel 75 mg daily for 12 months and aspirin 81 mg daily for 1 month. Antiplatelet therapy duration could be adjusted based on how long the patient is estimated to be on Eliquis for his pulmonary embolism. Diagnostic Dominance: Right  Intervention         Echo 04/2022: IMPRESSIONS     1. Left ventricular ejection fraction, by estimation, is 60 to 65%. The  left ventricle has normal function. The left ventricle has no regional  wall motion abnormalities. There is mild concentric left ventricular  hypertrophy. Left ventricular diastolic  parameters were normal.   2. Right ventricular systolic function is normal. The right ventricular  size is normal. There is normal pulmonary artery systolic pressure. The  estimated right ventricular systolic pressure is 21.7  mmHg.   3. The mitral valve is normal in structure. No evidence of mitral valve  regurgitation. No evidence of mitral stenosis.   4. The aortic valve is normal in structure. Aortic valve regurgitation is  not visualized. No aortic stenosis is present.   5. The inferior vena cava is normal in size with greater than 50%  respiratory variability, suggesting right atrial pressure of 3 mmHg.   Comparison(s): No prior Echocardiogram.  Recent  Labs: 05/10/2022: TSH 3.360 05/11/2022: BUN 11; Creatinine, Ser 1.13; Hemoglobin 13.4; Platelets 177; Potassium 3.2; Sodium 131  Recent Lipid Panel    Component Value Date/Time   CHOL 250 (H) 05/10/2022 0334   TRIG 78 05/10/2022 0334   HDL 39 (L) 05/10/2022 0334   CHOLHDL 6.4 05/10/2022 0334   VLDL 16 05/10/2022 0334   LDLCALC 195 (H) 05/10/2022 0334    History of Present Illness    62 year old male with the above past medical history including CAD s/p NSTEMI, DESx2-RI in 04/2022, hypertension, hyperlipidemia, hypokalemia, DVT, and OSA on CPAP.  He presented to the ED on 05/09/2022 with complaints of chest pain, shortness of breath, nausea, and fatigue.  Troponin was elevated.  He underwent cardiac catheterization in the setting of NSTEMI which revealed 50% stenosis in mid LAD, 50% stenosis in the distal RCA, 99% stenosis in the ramus post thrombotic (culprit lesion) s/p aspiration thrombectomy, DES.  A second 57% occluded lesion in the ostial ramus was also treated with DES.  He was started on Plavix and advised to take aspirin x 1 month in the setting of chronic DOAC therapy.  He was started on Lipitor.  Echocardiogram was normal. He was discharged home in stable condition on 05/11/2022.  He presents today for follow-up accompanied by his wife.  Since his hospitalization he has been stable overall from a cardiac standpoint.  His BP has been somewhat elevated.  He denies any symptoms concerning for angina.  He notes he is considering transitioning cardiology care to the [place]. Overall, he reports feeling well.  Home Medications    Current Outpatient Medications  Medication Sig Dispense Refill   amLODipine (NORVASC) 10 MG tablet Take 1 tablet (10 mg total) by mouth daily. 90 tablet 3   apixaban (ELIQUIS) 5 MG TABS tablet Take 5 mg by mouth 2 (two) times daily.     aspirin 81 MG chewable tablet Chew 1 tablet (81 mg total) by mouth daily. Take for 30 days then stop 30 tablet 0   atorvastatin  (LIPITOR) 80 MG tablet Take 1 tablet (80 mg total) by mouth daily. 90 tablet 3   carvedilol (COREG) 25 MG tablet Take 12.5 mg by mouth 2 (two) times daily with a meal.     clopidogrel (PLAVIX) 75 MG tablet Take 4 tablet once at 8 PM on 3/15. Then, take 75 mg (one tablet) daily 90 tablet 3   losartan (COZAAR) 50 MG tablet Take 1 tablet (50 mg total) by mouth daily. 90 tablet 3   nitroGLYCERIN (NITROSTAT) 0.4 MG SL tablet Place 1 tablet (0.4 mg total) under the tongue every 5 (five) minutes as needed for chest pain. 25 tablet 1   No current facility-administered medications for this visit.     Review of Systems    He denies chest pain, palpitations, dyspnea, pnd, orthopnea, n, v, dizziness, syncope, edema, weight gain, or early satiety. All other systems reviewed and are otherwise negative except as noted above.    Cardiac Rehabilitation Eligibility Assessment  The patient is  ready to start cardiac rehabilitation from a cardiac standpoint.    Physical Exam    VS:  BP (!) 160/100   Pulse 63   Ht 5\' 7"  (1.702 m)   Wt 198 lb (89.8 kg)   SpO2 97%   BMI 31.01 kg/m   GEN: Well nourished, well developed, in no acute distress. HEENT: normal. Neck: Supple, no JVD, carotid bruits, or masses. Cardiac: RRR, no murmurs, rubs, or gallops. No clubbing, cyanosis, edema.  Radials/DP/PT 2+ and equal bilaterally. Right groin with small hematoma, no bruising, bruit, or bleeding. Respiratory:  Respirations regular and unlabored, clear to auscultation bilaterally. GI: Soft, nontender, nondistended, BS + x 4. MS: no deformity or atrophy. Skin: warm and dry, no rash. Neuro:  Strength and sensation are intact. Psych: Normal affect.  Accessory Clinical Findings    ECG personally reviewed by me today -NSR, 63 bpm, RBBB- no acute changes.   Lab Results  Component Value Date   WBC 9.6 05/11/2022   HGB 13.4 05/11/2022   HCT 39.6 05/11/2022   MCV 86.1 05/11/2022   PLT 177 05/11/2022   Lab Results   Component Value Date   CREATININE 1.13 05/11/2022   BUN 11 05/11/2022   NA 131 (L) 05/11/2022   K 3.2 (L) 05/11/2022   CL 100 05/11/2022   CO2 22 05/11/2022   Lab Results  Component Value Date   ALT 23 09/18/2013   AST 24 09/18/2013   ALKPHOS 76 09/18/2013   BILITOT 0.2 (L) 09/18/2013   Lab Results  Component Value Date   CHOL 250 (H) 05/10/2022   HDL 39 (L) 05/10/2022   LDLCALC 195 (H) 05/10/2022   TRIG 78 05/10/2022   CHOLHDL 6.4 05/10/2022    Lab Results  Component Value Date   HGBA1C 6.3 (H) 05/10/2022    Assessment & Plan   1. CAD: S/p DESx2-RI. Stable with no anginal symptoms.  Continue aspirin x 1 month in setting of chronic DOAC therapy, Plavix, carvedilol, losartan, and amlodipine.    2. Hypertension: BP elevated in office today. Will increase amlodipine to 10 mg daily. Continue to monitor BP and report BP consistent greater than 130/80.    Otherwise, continue current antihypertensive regimen. Will repeat BMET today.   3. Hyperlipidemia: LDL was 195 in 04/2022.  Recently started on Lipitor.  Will plan for repeat fasting lipid panel, LFTs in 6 weeks.  4. History of DVT: On Eliquis.  5. OSA: Encouraged ongoing adherence to CPAP.  6. Disposition: Follow-up in 6 weeks.   HYPERTENSION CONTROL Vitals:   05/29/22 1048 05/29/22 1130  BP: (!) 160/118 (!) 160/100    The patient's blood pressure is elevated above target today.  In order to address the patient's elevated BP: Blood pressure will be monitored at home to determine if medication changes need to be made.; A current anti-hypertensive medication was adjusted today.; Follow up with general cardiology has been recommended.      Lenna Sciara, NP 05/29/2022, 1:31 PM

## 2022-05-30 ENCOUNTER — Telehealth: Payer: Self-pay | Admitting: Nurse Practitioner

## 2022-05-30 LAB — BASIC METABOLIC PANEL
BUN/Creatinine Ratio: 10 (ref 10–24)
BUN: 10 mg/dL (ref 8–27)
CO2: 24 mmol/L (ref 20–29)
Calcium: 10.1 mg/dL (ref 8.6–10.2)
Chloride: 103 mmol/L (ref 96–106)
Creatinine, Ser: 1.01 mg/dL (ref 0.76–1.27)
Glucose: 74 mg/dL (ref 70–99)
Potassium: 4.3 mmol/L (ref 3.5–5.2)
Sodium: 141 mmol/L (ref 134–144)
eGFR: 85 mL/min/{1.73_m2} (ref >59)

## 2022-05-30 NOTE — Telephone Encounter (Signed)
Will send over to covering [place] who will be calling results when they are available as a FYI.   Thank you!

## 2022-05-30 NOTE — Telephone Encounter (Signed)
Thank you :)

## 2022-05-30 NOTE — Telephone Encounter (Signed)
Pt returning nurses call. I did make pt aware that results are not back yet. He would like a callback once results are in. Also pt states that for future references he does not like using MYCHART and would prefer a phone call. Please advise

## 2022-05-30 NOTE — Telephone Encounter (Signed)
Patient is requesting a call back to discuss lab results. 

## 2022-05-30 NOTE — Telephone Encounter (Signed)
Called patient, unable to leave a voicemail. Will try again later.   Lab work is not currently resulted.

## 2022-05-31 NOTE — Telephone Encounter (Signed)
Spoke with pt. Pt was notified of lab results and will f/u as scheduled.

## 2022-07-03 ENCOUNTER — Encounter: Payer: No Typology Code available for payment source | Attending: Physician Assistant | Admitting: Dietician

## 2022-07-03 ENCOUNTER — Encounter: Payer: Self-pay | Admitting: Dietician

## 2022-07-03 DIAGNOSIS — E785 Hyperlipidemia, unspecified: Secondary | ICD-10-CM | POA: Insufficient documentation

## 2022-07-03 DIAGNOSIS — E669 Obesity, unspecified: Secondary | ICD-10-CM | POA: Diagnosis present

## 2022-07-03 NOTE — Progress Notes (Signed)
Medical Nutrition Therapy  Appointment Start time:  11:15  Appointment End time:  11:32  Primary concerns today: heart healthy diet  Referral diagnosis: obesity, hyperlipidemia Preferred learning style: no preference indicated (auditory, visual, hands on, no preference indicated) Learning readiness: preparation (not ready, contemplating, ready, change in progress)   NUTRITION ASSESSMENT   Anthropometrics  Height: 67 in  Weight today: pt declined (pt reports 190-192 lbs right now)  Clinical Medical Hx: MI,  Medications: amLODipine (NORVASC) 10 MG tablet apixaban (ELIQUIS) 5 MG TABS tablet aspirin 81 MG chewable tablet atorvastatin (LIPITOR) 80 MG tablet carvedilol (COREG) 25 MG tablet clopidogrel (PLAVIX) 75 MG tablet losartan (COZAAR) 50 MG tablet nitroGLYCERIN (NITROSTAT) 0.4 MG SL tablet Labs: A1c 6.3; Chol 250; HDL 39; LDL 195;  Notable Signs/Symptoms: walking with a cane; pt states due to heart attack in March.  Lifestyle & Dietary Hx  Pt states he dose not eat lunch, stating he has never eaten all three meals. Pt states he doesn't do well eating nuts. Pt states he is usually done eating about 5-6 pm in the evening. Pt states he feels like his knees are starting to give out, due to lack of strength, stating he had a fall outside the grocery store, but did not need medical attention. Pt states if he eats shellfish he has trouble with gout.   Estimated daily fluid intake: 64 oz Supplements:  Sleep: good, 6-6.5 hours a night Stress / self-care: play golf Current average weekly physical activity: [facility], currently going [facility] and planning to work with physical therapy.  24-Hr Dietary Recall First Meal: wheat toast, [place] sausage, egg, coffee Snack:  Second Meal: skip Snack:  Third Meal: salad, grilled salmon or [place] Snack: 4-5 cookies or laffy taffy, apple crisps (dehydrated apples) or strawberries or other fruit Beverages: water,  coffee  Estimated Energy Needs Calories: 1800   NUTRITION DIAGNOSIS  NB-1.1 Food and nutrition-related knowledge deficit As related to lack of previous information.  As evidenced by no previous documented MNT, and per patient.   NUTRITION INTERVENTION  Nutrition education (E-1) on the following topics:  Types of fat; avoid saturated fats, aim for heart healthy  a wide variety of fruits and vegetables whole grains and products made up mostly of whole grains healthy sources of protein (mostly plants such as legumes and nuts; fish and seafood; low-fat or nonfat dairy; and, if you eat meat and poultry, ensuring it is lean and unprocessed) liquid non-tropical vegetable oils minimally processed foods minimized intake of added sugars foods prepared with little or no salt limited or preferably no alcohol intake Pt is encouraged to consume less processed foods, as they have the potential to negatively affect overall health. Processed foods often destroy or remove nutrients from the product and/or have added salt, sugar, and saturated fats. Although not all processed foods are a concern, it is advised to have the majority of our foods be unprocessed or minimally processed as opposed to processed and ultra-processed. An example of this would be a whole apple (unprocessed), prepackaged apple slices with no additives (minimally processed), unsweetened applesauce (processed), and sweetened applesauce or apple juice with high fructose corn syrup (ultra-processed). For further information please visit [email or web address].  Why you need complex carbohydrates: Whole grains and other complex carbohydrates are required to have a healthy diet. Whole grains provide fiber which can help with blood glucose levels and help keep you satiated. Fruits and starchy vegetables provide essential vitamins and  minerals required for immune function, eyesight support, brain  support, bone density, wound healing and many other functions within the body. According to the current evidenced based 2020-2025 Dietary Guidelines for Americans, complex carbohydrates are part of a healthy eating pattern which is associated with a decreased risk for type 2 diabetes, cancers, and cardiovascular disease.   Encouraged pt to continue to eat balanced meals inclusive of non starchy vegetables 2 times a day 7 days a week Encouraged pt to choose lean protein sources: limiting beef, pork, sausage, hotdogs, and lunch meat Encourage pt to choose healthy fats such as plant based limiting animal fats Encouraged pt to continue to drink a minium 64 fluid ounces with half being plain water to satisfy proper hydration    Handouts Provided Include  Types of Fats Meal Ideas handout  Learning Style & Readiness for Change Teaching method utilized: Visual & Auditory  Demonstrated degree of understanding via: Teach Back  Barriers to learning/adherence to lifestyle change: nothing identified  Goals Established by Pt Use meal ideas handout to create balanced meals including lean meats, non-starchy vegetables and complex carbohydrates.   MONITORING & EVALUATION Dietary intake, weekly physical activity.  Next Steps  Patient is to follow up in 2 months.

## 2022-07-12 NOTE — Progress Notes (Deleted)
Office Visit    Patient Name: Ricky Peck Date of Encounter: 07/12/2022  Primary Care Provider:  [facility] Primary Cardiologist:  Thomasene Ripple, DO  Chief Complaint    62 year old male with a history of CAD s/p NSTEMI, DESx2-RI in 04/2022, hypertension, hyperlipidemia, hypokalemia, DVT, and OSA on CPAP who presents for hospital follow-up related to CAD.   Past Medical History    Past Medical History:  Diagnosis Date   Allergy    Arthritis    Hyperlipidemia    Hypertension    Sleep apnea    cpap   Past Surgical History:  Procedure Laterality Date   CORONARY STENT INTERVENTION N/A 05/10/2022   Procedure: CORONARY STENT INTERVENTION;  Surgeon: Corky Crafts, MD;  Location: [facility] INVASIVE CV LAB;  Service: Cardiovascular;  Laterality: N/A;   CORONARY THROMBECTOMY N/A 05/10/2022   Procedure: Coronary Thrombectomy;  Surgeon: Corky Crafts, MD;  Location: [facility] INVASIVE CV LAB;  Service: Cardiovascular;  Laterality: N/A;   LEFT HEART CATH AND CORONARY ANGIOGRAPHY N/A 05/10/2022   Procedure: LEFT HEART CATH AND CORONARY ANGIOGRAPHY;  Surgeon: Corky Crafts, MD;  Location: [facility] INVASIVE CV LAB;  Service: Cardiovascular;  Laterality: N/A;   quadricep surgery  2014    Allergies  Allergies  Allergen Reactions   Lisinopril Other (See Comments)     Labs/Other Studies Reviewed    The following studies were reviewed today:  Left Heart Catheterization 05/10/22   Mid LAD lesion is 50% stenosed.   Dist RCA lesion is 50% stenosed.   Ramus-2 lesion is 99% stenosed.  Thrombotic, culprit lesion.   After aspiration thrombectomy, A drug-eluting stent was successfully placed using a SYNERGY XD 2.50X24 and postdilated with a 2.5 [place] balloon   Post intervention, there is a 0% residual stenosis.   Ramus-1 lesion is 75% stenosed.   A drug-eluting stent was successfully placed using a SYNERGY XD 2.50X16 postdilated with a 2.5 [place] balloon.   Post intervention, there is  a 0% residual stenosis.   LV end diastolic pressure is normal.   There is no aortic valve stenosis.   Severe right subclavian tortuosity.  Would not use the right radial in the future if cardiac cath was needed.  Destination sheath was not available today to use no femoral approach was ultimately used successfully.   Diffuse coronary atherosclerosis.  Culprit lesion was a thrombotic subtotal occlusion of the mid ramus vessel.  There is also an ostial ramus lesion.  Both areas were successfully stented.   Brilinta was given in the Cath Lab but the patient is on Eliquis at home.  Could add clopidogrel 300 mg x 1 tomorrow.  The patient could then be on Eliquis 5 mg p.o. twice daily, clopidogrel 75 mg daily for 12 months and aspirin 81 mg daily for 1 month. Antiplatelet therapy duration could be adjusted based on how long the patient is estimated to be on Eliquis for his pulmonary embolism. Diagnostic Dominance: Right  Intervention          Echo 04/2022: IMPRESSIONS     1. Left ventricular ejection fraction, by estimation, is 60 to 65%. The  left ventricle has normal function. The left ventricle has no regional  wall motion abnormalities. There is mild concentric left ventricular  hypertrophy. Left ventricular diastolic  parameters were normal.   2. Right ventricular systolic function is normal. The right ventricular  size is normal. There is normal pulmonary artery systolic pressure. The  estimated right ventricular systolic pressure  is 21.7 mmHg.   3. The mitral valve is normal in structure. No evidence of mitral valve  regurgitation. No evidence of mitral stenosis.   4. The aortic valve is normal in structure. Aortic valve regurgitation is  not visualized. No aortic stenosis is present.   5. The inferior vena cava is normal in size with greater than 50%  respiratory variability, suggesting right atrial pressure of 3 mmHg.   Comparison(s): No prior Echocardiogram.   Recent  Labs: 05/10/2022: TSH 3.360 05/11/2022: Hemoglobin 13.4; Platelets 177 05/29/2022: BUN 10; Creatinine, Ser 1.01; Potassium 4.3; Sodium 141  Recent Lipid Panel    Component Value Date/Time   CHOL 250 (H) 05/10/2022 0334   TRIG 78 05/10/2022 0334   HDL 39 (L) 05/10/2022 0334   CHOLHDL 6.4 05/10/2022 0334   VLDL 16 05/10/2022 0334   LDLCALC 195 (H) 05/10/2022 0334    History of Present Illness   62 year old male with the above past medical history including CAD s/p NSTEMI, DESx2-RI in 04/2022, hypertension, hyperlipidemia, hypokalemia, DVT, and OSA on CPAP.   He presented to the ED on 05/09/2022 with complaints of chest pain, shortness of breath, nausea, and fatigue.  Troponin was elevated.  He underwent cardiac catheterization in the setting of NSTEMI which revealed 50% stenosis in mid LAD, 50% stenosis in the distal RCA, 99% stenosis in the ramus post thrombotic (culprit lesion) s/p aspiration thrombectomy, DES.  A second 57% occluded lesion in the ostial ramus was also treated with DES.  He was started on Plavix and advised to take aspirin x 1 month in the setting of chronic DOAC therapy.  He was started on Lipitor.  Echocardiogram was normal.  He was last seen in the office on 05/29/2022 was stable overall from a cardiac standpoint.  He denies symptoms concerning for angina.   He presents today for follow-up accompanied by his wife.  Since his last visit he has   1. CAD: S/p DESx2-RI. Stable with no anginal symptoms.  Continue aspirin x 1 month in setting of chronic DOAC therapy, Plavix, carvedilol, losartan, and amlodipine.     2. Hypertension: BP elevated in office today. Will increase amlodipine to 10 mg daily. Continue to monitor BP and report BP consistent greater than 130/80.    Otherwise, continue current antihypertensive regimen. Will repeat BMET today.    3. Hyperlipidemia: LDL was 195 in 04/2022.  Recently started on Lipitor.  Will plan for repeat fasting lipid panel, LFTs in 6 weeks.    4. History of DVT: On Eliquis.   5. OSA: Encouraged ongoing adherence to CPAP.   6. Disposition: Follow-up in   Home Medications    Current Outpatient Medications  Medication Sig Dispense Refill   amLODipine (NORVASC) 10 MG tablet Take 1 tablet (10 mg total) by mouth daily. 90 tablet 3   apixaban (ELIQUIS) 5 MG TABS tablet Take 5 mg by mouth 2 (two) times daily.     aspirin 81 MG chewable tablet Chew 1 tablet (81 mg total) by mouth daily. Take for 30 days then stop 30 tablet 0   atorvastatin (LIPITOR) 80 MG tablet Take 1 tablet (80 mg total) by mouth daily. 90 tablet 3   carvedilol (COREG) 25 MG tablet Take 12.5 mg by mouth 2 (two) times daily with a meal.     clopidogrel (PLAVIX) 75 MG tablet Take 4 tablet once at 8 PM on 3/15. Then, take 75 mg (one tablet) daily 90 tablet 3   losartan (COZAAR) 50 MG  tablet Take 1 tablet (50 mg total) by mouth daily. 90 tablet 3   nitroGLYCERIN (NITROSTAT) 0.4 MG SL tablet Place 1 tablet (0.4 mg total) under the tongue every 5 (five) minutes as needed for chest pain. 25 tablet 1   No current facility-administered medications for this visit.     Review of Systems    ***.  All other systems reviewed and are otherwise negative except as noted above. {The patient has an active order for outpatient cardiac rehabilitation.   Please indicate if the patient is ready to start. Do NOT delete this.  It will auto delete.  Refresh note, then sign.              Click here to document readiness and see contraindications.  :1}  Cardiac Rehabilitation Eligibility Assessment      Physical Exam    VS:  There were no vitals taken for this visit. , BMI There is no height or weight on file to calculate BMI.     GEN: Well nourished, well developed, in no acute distress. HEENT: normal. Neck: Supple, no JVD, carotid bruits, or masses. Cardiac: RRR, no murmurs, rubs, or gallops. No clubbing, cyanosis, edema.  Radials/DP/PT 2+ and equal bilaterally.  Respiratory:   Respirations regular and unlabored, clear to auscultation bilaterally. GI: Soft, nontender, nondistended, BS + x 4. MS: no deformity or atrophy. Skin: warm and dry, no rash. Neuro:  Strength and sensation are intact. Psych: Normal affect.  Accessory Clinical Findings    ECG personally reviewed by me today - *** - no acute changes.   Lab Results  Component Value Date   WBC 9.6 05/11/2022   HGB 13.4 05/11/2022   HCT 39.6 05/11/2022   MCV 86.1 05/11/2022   PLT 177 05/11/2022   Lab Results  Component Value Date   CREATININE 1.01 05/29/2022   BUN 10 05/29/2022   NA 141 05/29/2022   K 4.3 05/29/2022   CL 103 05/29/2022   CO2 24 05/29/2022   Lab Results  Component Value Date   ALT 23 09/18/2013   AST 24 09/18/2013   ALKPHOS 76 09/18/2013   BILITOT 0.2 (L) 09/18/2013   Lab Results  Component Value Date   CHOL 250 (H) 05/10/2022   HDL 39 (L) 05/10/2022   LDLCALC 195 (H) 05/10/2022   TRIG 78 05/10/2022   CHOLHDL 6.4 05/10/2022    Lab Results  Component Value Date   HGBA1C 6.3 (H) 05/10/2022    Assessment & Plan    1.  ***  No BP recorded.  {Refresh Note OR Click here to enter BP  :1}***   Joylene Grapes, NP 07/12/2022, 12:11 PM

## 2022-07-13 ENCOUNTER — Ambulatory Visit: Payer: No Typology Code available for payment source | Attending: Nurse Practitioner | Admitting: Nurse Practitioner

## 2022-07-13 DIAGNOSIS — I2510 Atherosclerotic heart disease of native coronary artery without angina pectoris: Secondary | ICD-10-CM

## 2022-07-13 DIAGNOSIS — G4733 Obstructive sleep apnea (adult) (pediatric): Secondary | ICD-10-CM

## 2022-07-13 DIAGNOSIS — I10 Essential (primary) hypertension: Secondary | ICD-10-CM

## 2022-07-13 DIAGNOSIS — E785 Hyperlipidemia, unspecified: Secondary | ICD-10-CM

## 2022-07-13 DIAGNOSIS — Z86718 Personal history of other venous thrombosis and embolism: Secondary | ICD-10-CM

## 2022-08-21 ENCOUNTER — Telehealth (HOSPITAL_COMMUNITY): Payer: Self-pay

## 2022-08-21 NOTE — Telephone Encounter (Signed)
Pt is not interested in the cardiac rehab program. Closed referral 

## 2022-09-04 ENCOUNTER — Ambulatory Visit: Payer: No Typology Code available for payment source | Admitting: Dietician
